# Patient Record
Sex: Female | Born: 2000 | Race: White | Hispanic: No | Marital: Single | Smoking: Never smoker
Health system: Southern US, Community
[De-identification: ages and names within clinical notes are randomized; demographics above are authoritative.]

## PROBLEM LIST (undated history)

## (undated) DIAGNOSIS — F4310 Post-traumatic stress disorder, unspecified: Secondary | ICD-10-CM

## (undated) DIAGNOSIS — F329 Major depressive disorder, single episode, unspecified: Secondary | ICD-10-CM

## (undated) DIAGNOSIS — F909 Attention-deficit hyperactivity disorder, unspecified type: Secondary | ICD-10-CM

## (undated) DIAGNOSIS — F32A Depression, unspecified: Secondary | ICD-10-CM

---
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
## 2018-01-01 ENCOUNTER — Ambulatory Visit (INDEPENDENT_AMBULATORY_CARE_PROVIDER_SITE_OTHER): Payer: 59 | Admitting: Licensed Clinical Social Worker

## 2018-01-01 DIAGNOSIS — F331 Major depressive disorder, recurrent, moderate: Secondary | ICD-10-CM | POA: Diagnosis not present

## 2018-01-02 ENCOUNTER — Encounter (HOSPITAL_COMMUNITY): Payer: Self-pay | Admitting: Licensed Clinical Social Worker

## 2018-01-02 ENCOUNTER — Telehealth (HOSPITAL_COMMUNITY): Payer: Self-pay

## 2018-01-02 NOTE — Telephone Encounter (Signed)
Patient said that she needs the letter for her medical insurance

## 2018-01-02 NOTE — Telephone Encounter (Signed)
Have her bring in the request from the insurance company

## 2018-01-02 NOTE — Telephone Encounter (Signed)
I need more information about why she needs it and who it is for. Since letters are part of the EMR we cannot email them. I can print out a copy for her or fax it

## 2018-01-02 NOTE — Telephone Encounter (Signed)
Patient called requesting a letter stating that she is a patient her and she is being treated by you. Patient asked if she could be e-mail the letter. Please advise

## 2018-01-02 NOTE — Telephone Encounter (Signed)
Called and spoke with patient she said that she will drop off the request from the insurance company

## 2018-01-02 NOTE — Progress Notes (Signed)
   THERAPIST PROGRESS NOTE  Session Time: 2:50 pm-3:30 pm  Participation Level: Active  Behavioral Response: CasualAlertEuthymic  Type of Therapy: Individual Therapy  Treatment Goals addressed: Coping  Interventions: CBT and Solution Focused  Summary: Judith Cole is a 17 y.o. female who presents with Patient is an 17 year old Caucasian female that presents oriented x5 (person, place, sitaution, time, and object), alert, euthymic, average height, average weight, casually dressed, appropriately groomed and cooperative to address mood. Patient has limited medical treatment history. She has a history of mental health treatment including outpatient therapy, medication management, hospitalization, and group home. Patient has symptoms of mania including periods of euphoria, increased energy, and decrease of sleep. Patient denies suicidal and homicidal ideations. Patient denies psychosis including auditory and visual hallucinations. Patient admits to cannabis use to manage mood. Patient denies history of elopement. She is at low risk for lethality at this time.  Physically: Patient is doing well physically. She has felt tired  Spiritual/values: No issues identified  Relationships: Patient's polyamorous relationship is going well.  Emotional/Mental/Behavior: Patient's mood is stable. She has experienced sensory issues especially when time gets stressed. After discussion, patient understood she needs to build up her tolerance level. Patient has been able to do it before with clothing. Patient understood that she needs to "practice" around the house with her socks and pants that cause her distress. Patient also noted a feeling of de-personalization when she wakes up in the morning. After discussion, patient understood that she needs to use grounding techniques to help bring her back to reality. She also agreed to focus on her ring or another physical object that she can touch when she wakes up in the  morning to know that she is awake.   Suicidal/Homicidal: Negativewithout intent/plan  Therapist Response: Therapist reviewed patient's recent thoughts and behaviors. Therapist utilized CBT to address mood. Therapist processed patient's feelings to identify triggers for mood. Therapist discussed distress tolerance and grounding techniques.  Plan: Return again in 3 weeks.  Diagnosis: Axis I: Major depressive disorder, recurrent episode, moderate with anxious distress    Axis II: No diagnosis    Judith BellowsJoshua Livvy Spilman, LCSW 01/02/2018

## 2018-01-07 ENCOUNTER — Telehealth (HOSPITAL_COMMUNITY): Payer: Self-pay | Admitting: *Deleted

## 2018-01-07 NOTE — Telephone Encounter (Signed)
Dr Tenny Crawoss Patient came in today requesting information that [facility] sent her via e-mail. They are requesting documentation from the provider detailing their services , frequency of services & duration services needed, And what services are rendered ( therapy & provider). Also professional recommendations if services are still  needed

## 2018-01-07 NOTE — Telephone Encounter (Signed)
ok 

## 2018-02-04 ENCOUNTER — Encounter (HOSPITAL_COMMUNITY): Payer: Self-pay | Admitting: Psychiatry

## 2018-02-04 ENCOUNTER — Ambulatory Visit (INDEPENDENT_AMBULATORY_CARE_PROVIDER_SITE_OTHER): Payer: 59 | Admitting: Psychiatry

## 2018-02-04 VITALS — BP 107/74 | HR 87 | Ht 66.0 in | Wt 159.0 lb

## 2018-02-04 DIAGNOSIS — Z813 Family history of other psychoactive substance abuse and dependence: Secondary | ICD-10-CM

## 2018-02-04 DIAGNOSIS — R450 Nervousness: Secondary | ICD-10-CM

## 2018-02-04 DIAGNOSIS — F1290 Cannabis use, unspecified, uncomplicated: Secondary | ICD-10-CM

## 2018-02-04 DIAGNOSIS — Z818 Family history of other mental and behavioral disorders: Secondary | ICD-10-CM

## 2018-02-04 DIAGNOSIS — F331 Major depressive disorder, recurrent, moderate: Secondary | ICD-10-CM

## 2018-02-04 DIAGNOSIS — F4310 Post-traumatic stress disorder, unspecified: Secondary | ICD-10-CM | POA: Diagnosis not present

## 2018-02-04 MED ORDER — LAMOTRIGINE 100 MG PO TABS
100.0000 mg | ORAL_TABLET | Freq: Every day | ORAL | 2 refills | Status: DC
Start: 2018-02-04 — End: 2018-03-18

## 2018-02-04 MED ORDER — SERTRALINE HCL 100 MG PO TABS: 150.0000 mg | ORAL_TABLET | Freq: Every day | ORAL | 2 refills | Status: DC

## 2018-02-04 MED ORDER — HYDROXYZINE HCL 25 MG PO TABS: 50.0000 mg | ORAL_TABLET | Freq: Three times a day (TID) | ORAL | 2 refills | Status: DC | PRN

## 2018-02-04 MED ORDER — QUETIAPINE FUMARATE 100 MG PO TABS: 100.0000 mg | ORAL_TABLET | Freq: Every day | ORAL | 2 refills | Status: DC
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
## 2018-02-24 ENCOUNTER — Ambulatory Visit (HOSPITAL_COMMUNITY): Payer: Self-pay | Admitting: Licensed Clinical Social Worker

## 2018-03-18 ENCOUNTER — Ambulatory Visit (INDEPENDENT_AMBULATORY_CARE_PROVIDER_SITE_OTHER): Payer: 59 | Admitting: Psychiatry

## 2018-03-18 ENCOUNTER — Encounter (HOSPITAL_COMMUNITY): Payer: Self-pay | Admitting: Psychiatry

## 2018-03-18 VITALS — BP 108/74 | HR 73 | Ht 66.0 in | Wt 156.0 lb

## 2018-03-18 DIAGNOSIS — F4310 Post-traumatic stress disorder, unspecified: Secondary | ICD-10-CM

## 2018-03-18 DIAGNOSIS — F1290 Cannabis use, unspecified, uncomplicated: Secondary | ICD-10-CM

## 2018-03-18 DIAGNOSIS — F331 Major depressive disorder, recurrent, moderate: Secondary | ICD-10-CM | POA: Diagnosis not present

## 2018-03-18 MED ORDER — QUETIAPINE FUMARATE 100 MG PO TABS: 100.0000 mg | ORAL_TABLET | Freq: Every day | ORAL | 2 refills | Status: DC

## 2018-03-18 MED ORDER — LAMOTRIGINE 100 MG PO TABS: 100.0000 mg | ORAL_TABLET | Freq: Every day | ORAL | 2 refills | Status: DC

## 2018-03-18 MED ORDER — SERTRALINE HCL 100 MG PO TABS: 150.0000 mg | ORAL_TABLET | Freq: Every day | ORAL | 2 refills | Status: DC

## 2018-03-18 MED ORDER — HYDROXYZINE HCL 25 MG PO TABS: 50.0000 mg | ORAL_TABLET | Freq: Three times a day (TID) | ORAL | 2 refills | Status: DC | PRN
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
## 2018-05-19 ENCOUNTER — Ambulatory Visit (INDEPENDENT_AMBULATORY_CARE_PROVIDER_SITE_OTHER): Payer: 59 | Admitting: Psychiatry

## 2018-05-19 ENCOUNTER — Encounter (HOSPITAL_COMMUNITY): Payer: Self-pay | Admitting: Psychiatry

## 2018-05-19 VITALS — BP 110/75 | HR 84 | Ht 66.0 in | Wt 166.0 lb

## 2018-05-19 DIAGNOSIS — F4310 Post-traumatic stress disorder, unspecified: Secondary | ICD-10-CM

## 2018-05-19 DIAGNOSIS — F331 Major depressive disorder, recurrent, moderate: Secondary | ICD-10-CM | POA: Diagnosis not present

## 2018-05-19 MED ORDER — QUETIAPINE FUMARATE 100 MG PO TABS: 100.0000 mg | ORAL_TABLET | Freq: Every day | ORAL | 2 refills | Status: DC

## 2018-05-19 MED ORDER — SERTRALINE HCL 100 MG PO TABS: 200.0000 mg | ORAL_TABLET | Freq: Every day | ORAL | 2 refills | Status: DC

## 2018-05-19 MED ORDER — HYDROXYZINE HCL 25 MG PO TABS: 50.0000 mg | ORAL_TABLET | Freq: Three times a day (TID) | ORAL | 2 refills | Status: DC | PRN

## 2018-05-19 MED ORDER — LAMOTRIGINE 100 MG PO TABS: 100.0000 mg | ORAL_TABLET | Freq: Every day | ORAL | 2 refills | Status: DC
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
## 2018-05-28 ENCOUNTER — Ambulatory Visit (HOSPITAL_COMMUNITY): Payer: 59 | Admitting: Licensed Clinical Social Worker

## 2018-07-01 ENCOUNTER — Ambulatory Visit (HOSPITAL_COMMUNITY): Payer: 59 | Admitting: Psychiatry

## 2018-07-16 ENCOUNTER — Telehealth (HOSPITAL_COMMUNITY): Payer: Self-pay | Admitting: *Deleted

## 2018-07-16 ENCOUNTER — Other Ambulatory Visit (HOSPITAL_COMMUNITY): Payer: Self-pay | Admitting: Psychiatry

## 2018-07-16 MED ORDER — SERTRALINE HCL 100 MG PO TABS: 200.0000 mg | ORAL_TABLET | Freq: Every day | ORAL | 2 refills | Status: DC

## 2018-07-16 NOTE — Telephone Encounter (Signed)
I resent it. It had already been sent last month, should have been at the pharmacy

## 2018-07-16 NOTE — Telephone Encounter (Signed)
Dr Tenny Craw Patient called to request refill on Zoloft she states she only has 1 pill left. Stated she missed her last appointment & I informed he that she would need to make new appointment. I informed that I would send request to you & then transferred to [facility] for appointment

## 2018-07-29 ENCOUNTER — Ambulatory Visit (HOSPITAL_COMMUNITY): Payer: 59 | Admitting: Psychiatry

## 2018-08-20 ENCOUNTER — Ambulatory Visit (INDEPENDENT_AMBULATORY_CARE_PROVIDER_SITE_OTHER): Payer: 59 | Admitting: Psychiatry

## 2018-08-20 ENCOUNTER — Encounter (HOSPITAL_COMMUNITY): Payer: Self-pay | Admitting: Psychiatry

## 2018-08-20 VITALS — BP 122/76 | HR 106 | Ht 66.0 in | Wt 161.0 lb

## 2018-08-20 DIAGNOSIS — F331 Major depressive disorder, recurrent, moderate: Secondary | ICD-10-CM | POA: Diagnosis not present

## 2018-08-20 DIAGNOSIS — F4310 Post-traumatic stress disorder, unspecified: Secondary | ICD-10-CM | POA: Diagnosis not present

## 2018-08-20 MED ORDER — LAMOTRIGINE 100 MG PO TABS: 100.0000 mg | ORAL_TABLET | Freq: Every day | ORAL | 2 refills | Status: DC

## 2018-08-20 MED ORDER — SERTRALINE HCL 100 MG PO TABS: 200.0000 mg | ORAL_TABLET | Freq: Every day | ORAL | 2 refills | Status: DC

## 2018-08-20 MED ORDER — HYDROXYZINE HCL 10 MG PO TABS: 10.0000 mg | ORAL_TABLET | Freq: Two times a day (BID) | ORAL | 2 refills | Status: DC | PRN

## 2018-08-20 MED ORDER — QUETIAPINE FUMARATE 100 MG PO TABS: 100.0000 mg | ORAL_TABLET | Freq: Every day | ORAL | 2 refills | Status: DC
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
## 2018-09-13 ENCOUNTER — Other Ambulatory Visit (HOSPITAL_COMMUNITY): Payer: Self-pay | Admitting: Psychiatry

## 2018-09-15 ENCOUNTER — Other Ambulatory Visit (HOSPITAL_COMMUNITY): Payer: Self-pay | Admitting: Psychiatry

## 2018-09-15 MED ORDER — HYDROXYZINE HCL 10 MG PO TABS: 10.0000 mg | ORAL_TABLET | Freq: Two times a day (BID) | ORAL | 1 refills | Status: DC | PRN

## 2018-11-21 ENCOUNTER — Encounter (HOSPITAL_COMMUNITY): Payer: Self-pay | Admitting: Psychiatry

## 2018-11-21 ENCOUNTER — Other Ambulatory Visit: Payer: Self-pay

## 2018-11-21 ENCOUNTER — Ambulatory Visit (INDEPENDENT_AMBULATORY_CARE_PROVIDER_SITE_OTHER): Payer: 59 | Admitting: Psychiatry

## 2018-11-21 DIAGNOSIS — F4310 Post-traumatic stress disorder, unspecified: Secondary | ICD-10-CM

## 2018-11-21 DIAGNOSIS — F331 Major depressive disorder, recurrent, moderate: Secondary | ICD-10-CM

## 2018-11-21 MED ORDER — QUETIAPINE FUMARATE 100 MG PO TABS: 100.0000 mg | ORAL_TABLET | Freq: Every day | ORAL | 2 refills | Status: DC

## 2018-11-21 MED ORDER — LAMOTRIGINE 100 MG PO TABS: 100.0000 mg | ORAL_TABLET | Freq: Every day | ORAL | 2 refills | Status: DC

## 2018-11-21 MED ORDER — SERTRALINE HCL 100 MG PO TABS: 200.0000 mg | ORAL_TABLET | Freq: Every day | ORAL | 2 refills | Status: DC
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
## 2019-01-02 IMAGING — DX DG ANKLE COMPLETE 3+V*R*
3 series · 3 of 3 positions shown · non-contrast
Comparison: None.

CLINICAL DATA: Acute RIGHT ankle pain following injury today.
Initial encounter.

EXAM:
RIGHT ANKLE - COMPLETE 3+ VIEW

[ankle ap]
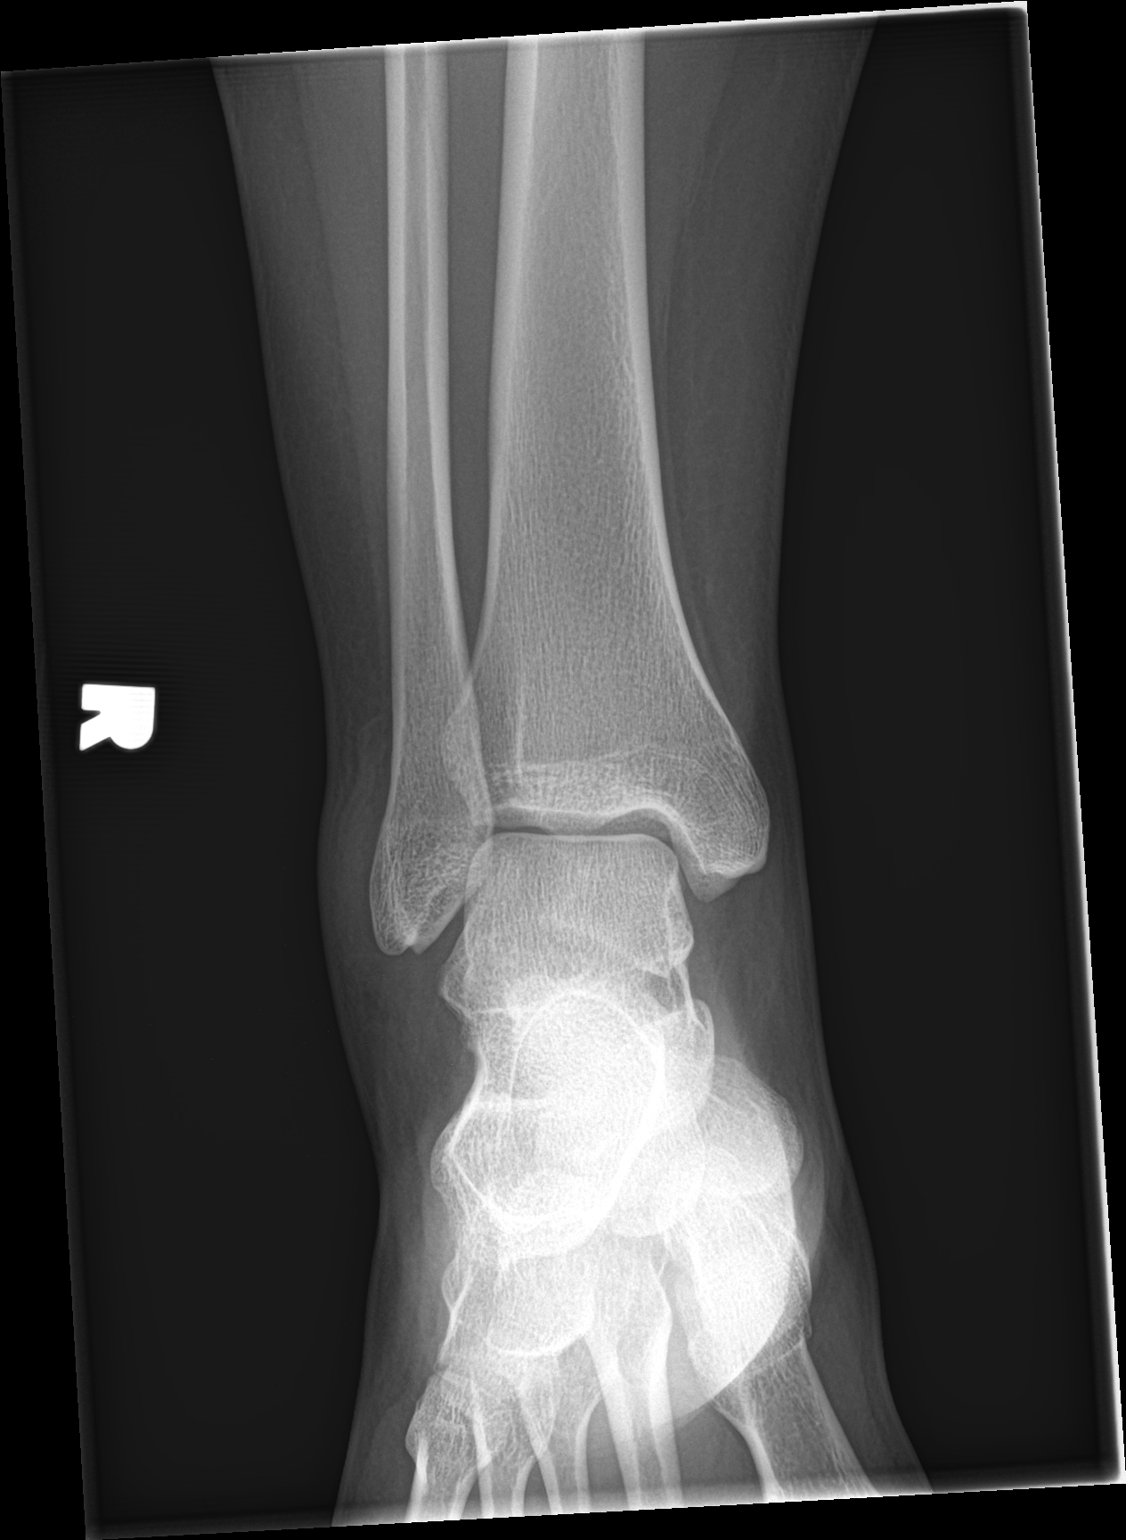

[ankle obl]
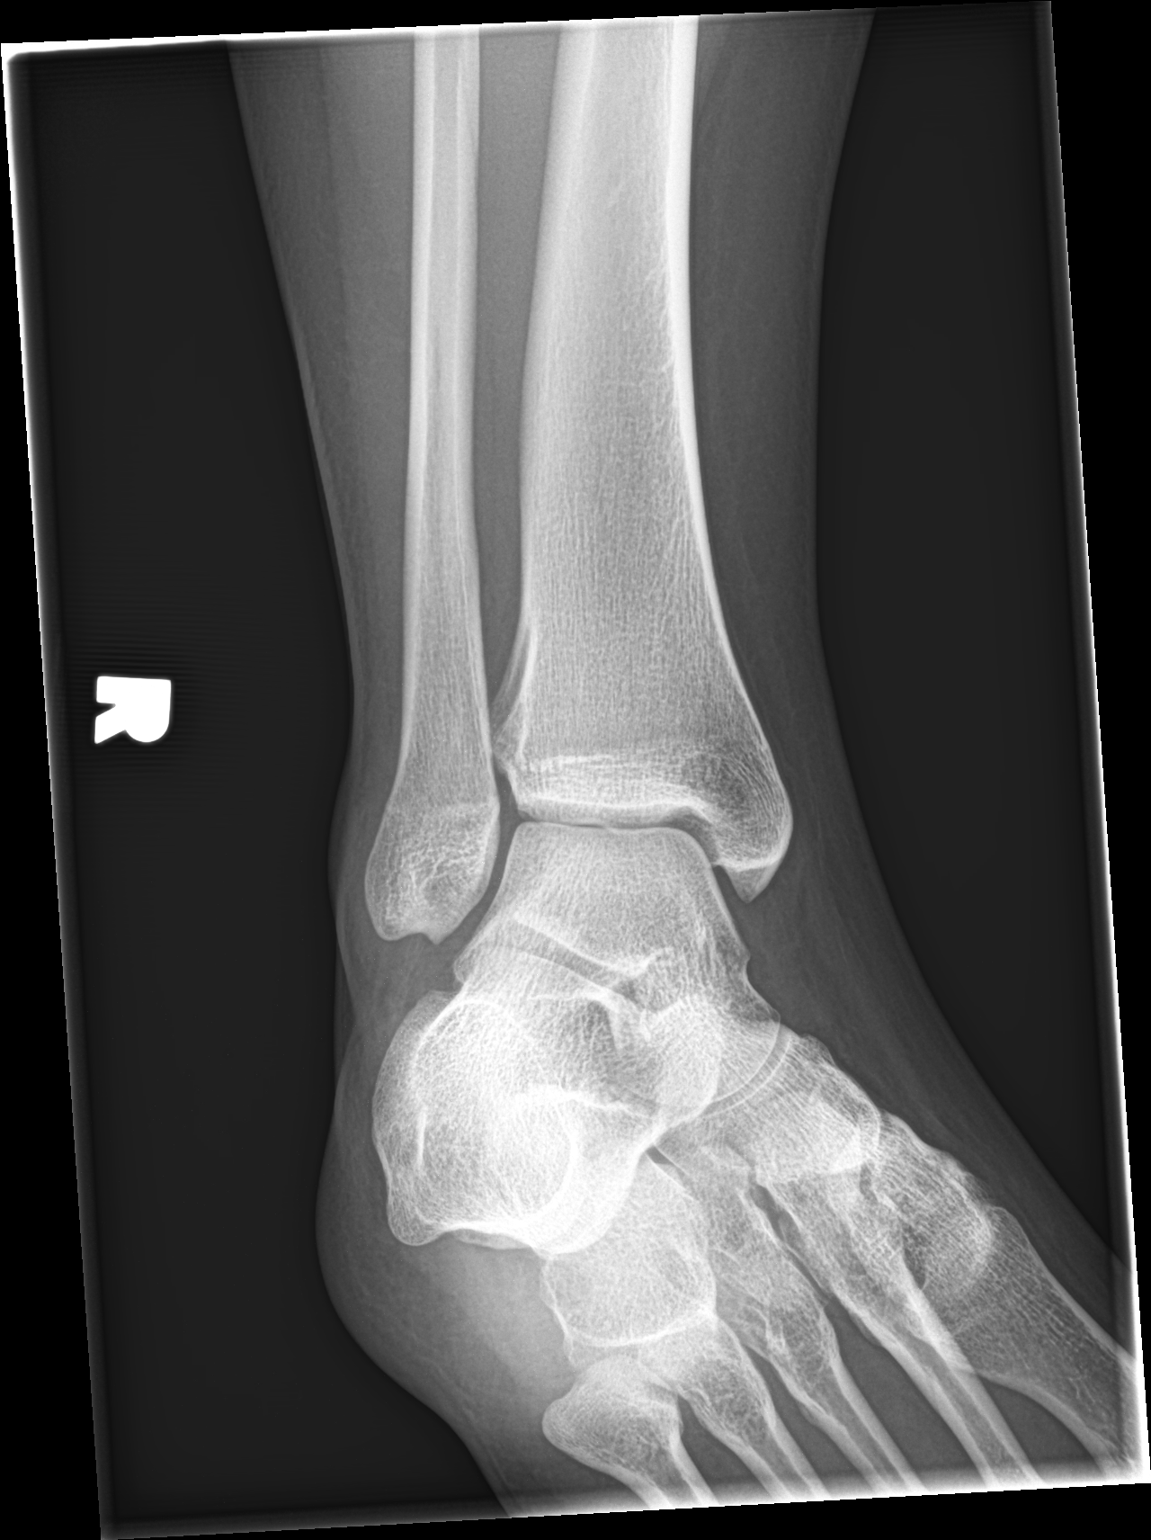

[ankle lat]
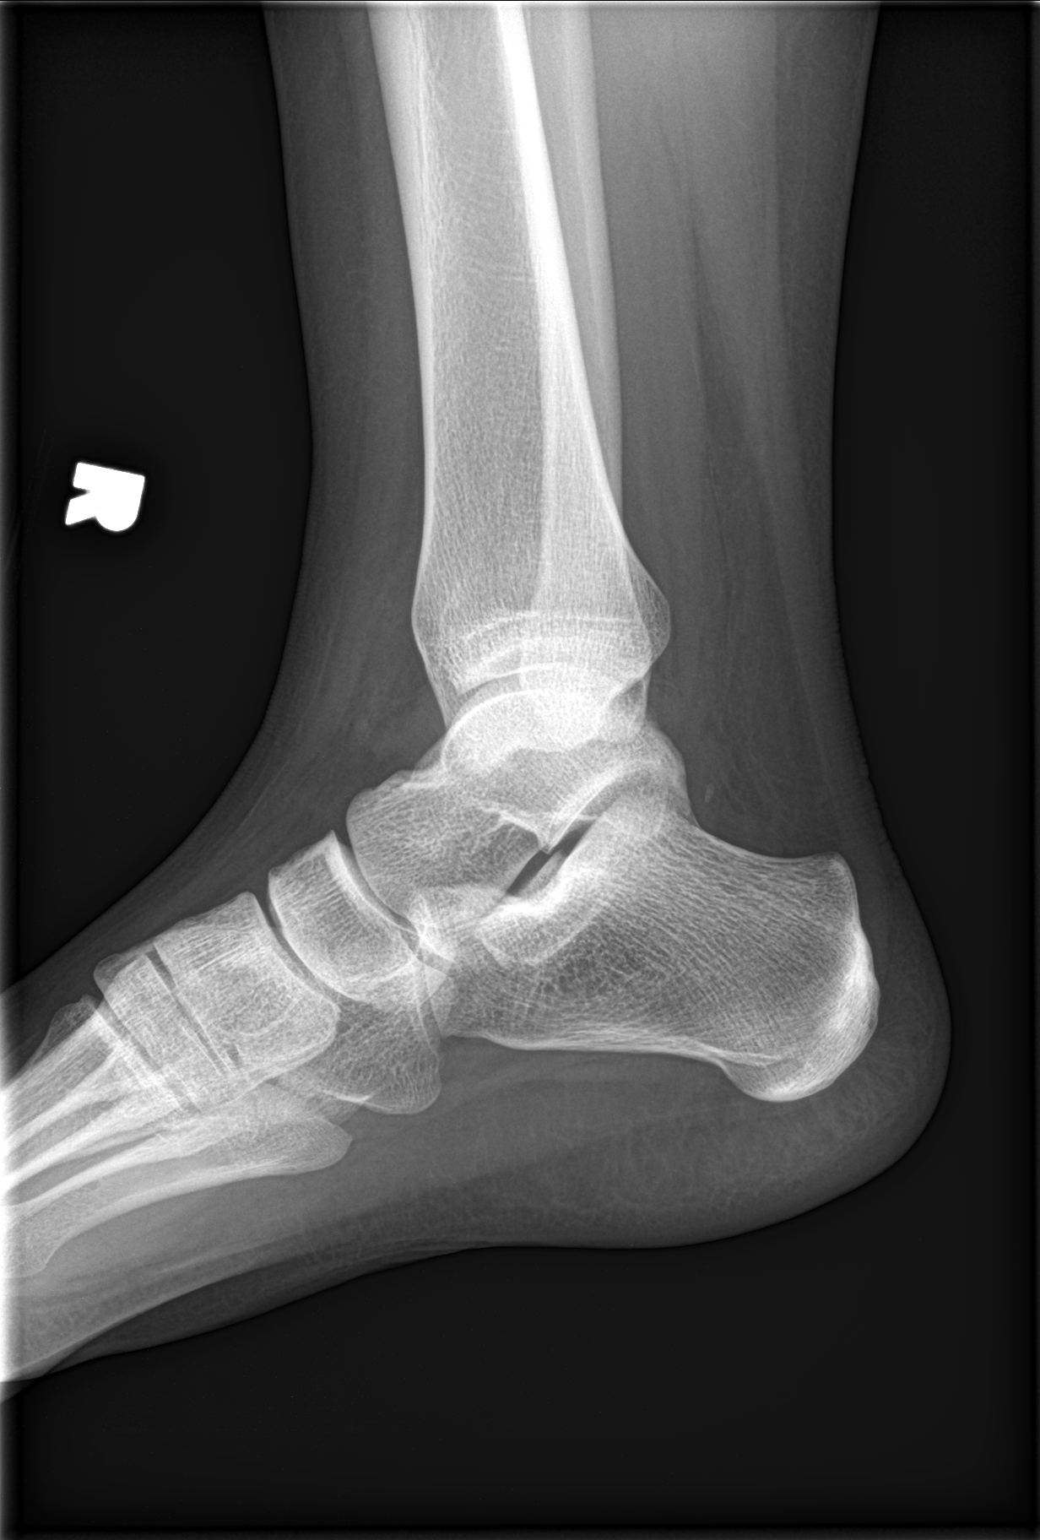

[3 of 3 positions shown; findings below may reference images not displayed]

FINDINGS: There is no evidence of acute fracture, subluxation or dislocation.

LATERAL soft tissue swelling and joint effusion noted.

No focal bony lesions are present.
IMPRESSION: Soft tissue swelling and joint effusion.  No acute bony abnormality.

## 2019-02-13 ENCOUNTER — Encounter (HOSPITAL_COMMUNITY): Payer: Self-pay | Admitting: Psychiatry

## 2019-02-19 ENCOUNTER — Other Ambulatory Visit: Payer: Self-pay

## 2019-02-19 ENCOUNTER — Encounter (HOSPITAL_COMMUNITY): Payer: Self-pay | Admitting: Psychiatry

## 2019-02-19 ENCOUNTER — Telehealth (HOSPITAL_COMMUNITY): Payer: Self-pay | Admitting: *Deleted

## 2019-02-19 ENCOUNTER — Ambulatory Visit (INDEPENDENT_AMBULATORY_CARE_PROVIDER_SITE_OTHER): Payer: 59 | Admitting: Psychiatry

## 2019-02-19 DIAGNOSIS — F4310 Post-traumatic stress disorder, unspecified: Secondary | ICD-10-CM | POA: Diagnosis not present

## 2019-02-19 DIAGNOSIS — F331 Major depressive disorder, recurrent, moderate: Secondary | ICD-10-CM | POA: Diagnosis not present

## 2019-02-19 MED ORDER — QUETIAPINE FUMARATE 100 MG PO TABS: 100.0000 mg | ORAL_TABLET | Freq: Every day | ORAL | 2 refills | Status: DC

## 2019-02-19 MED ORDER — HYDROXYZINE HCL 10 MG PO TABS: 10.0000 mg | ORAL_TABLET | Freq: Two times a day (BID) | ORAL | 1 refills | Status: DC | PRN

## 2019-02-19 MED ORDER — LAMOTRIGINE 100 MG PO TABS: 100.0000 mg | ORAL_TABLET | Freq: Every day | ORAL | 2 refills | Status: DC

## 2019-02-19 MED ORDER — SERTRALINE HCL 100 MG PO TABS: 200.0000 mg | ORAL_TABLET | Freq: Every day | ORAL | 2 refills | Status: DC

## 2019-02-19 NOTE — Telephone Encounter (Signed)
[place] TRACK PRIOR AUTHORIZATION APPROVAL  APPROVED QUETIAPINE FUMARATE 100 MG TABLETS  P.A. # [id number]  EFFECTIVE: 02/19/2019 UNTIL 02/14/2020
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
## 2019-05-21 ENCOUNTER — Other Ambulatory Visit: Payer: Self-pay

## 2019-05-21 ENCOUNTER — Encounter (HOSPITAL_COMMUNITY): Payer: Self-pay | Admitting: Psychiatry

## 2019-05-21 ENCOUNTER — Ambulatory Visit (INDEPENDENT_AMBULATORY_CARE_PROVIDER_SITE_OTHER): Payer: 59 | Admitting: Psychiatry

## 2019-05-21 DIAGNOSIS — F331 Major depressive disorder, recurrent, moderate: Secondary | ICD-10-CM

## 2019-05-21 DIAGNOSIS — F4310 Post-traumatic stress disorder, unspecified: Secondary | ICD-10-CM | POA: Diagnosis not present

## 2019-05-21 MED ORDER — LAMOTRIGINE 100 MG PO TABS: 100.0000 mg | ORAL_TABLET | Freq: Every day | ORAL | 2 refills | Status: DC

## 2019-05-21 MED ORDER — QUETIAPINE FUMARATE 100 MG PO TABS: 100.0000 mg | ORAL_TABLET | Freq: Every day | ORAL | 2 refills | Status: DC

## 2019-05-21 MED ORDER — SERTRALINE HCL 100 MG PO TABS: 200.0000 mg | ORAL_TABLET | Freq: Every day | ORAL | 2 refills | Status: DC
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
## 2019-08-24 ENCOUNTER — Ambulatory Visit (HOSPITAL_COMMUNITY): Payer: 59 | Admitting: Psychiatry

## 2020-06-27 ENCOUNTER — Other Ambulatory Visit (HOSPITAL_COMMUNITY): Payer: Self-pay | Admitting: Psychiatry

## 2020-06-28 ENCOUNTER — Other Ambulatory Visit (HOSPITAL_COMMUNITY): Payer: Self-pay | Admitting: Psychiatry

## 2020-06-29 ENCOUNTER — Other Ambulatory Visit (HOSPITAL_COMMUNITY): Payer: Self-pay | Admitting: Psychiatry

## 2020-06-29 ENCOUNTER — Telehealth (HOSPITAL_COMMUNITY): Payer: Self-pay | Admitting: Psychiatry

## 2020-06-29 NOTE — Telephone Encounter (Signed)
You can schedule her, I saw her for a long time

## 2020-06-29 NOTE — Telephone Encounter (Signed)
Patient called asking for medications and has not been seen in over a year and 1 month +. Advised if patients have not been seen in 1year or more new referral is needed. Advised provider would need to do new evaluation to verify the same meds along with same mgs are needed, etc., and this is if provider accepts new referral. Advised patient that provider only accepts referrals for children at this time. Did notify patient that I would inform provider of her call.

## 2020-06-29 NOTE — Telephone Encounter (Signed)
Dr. Tenny Craw I wanted to make sure, you said you've seen this patient for years. It shows she was seen for the first time in 06/2017 and she stopped scheduling appointments after 05/2019. I just wanted to make sure we are talking about the same patient...but I will call her and schedule I just wanted to confirm/ verify

## 2020-06-30 ENCOUNTER — Other Ambulatory Visit: Payer: Self-pay

## 2020-06-30 ENCOUNTER — Telehealth (INDEPENDENT_AMBULATORY_CARE_PROVIDER_SITE_OTHER): Payer: 59 | Admitting: Psychiatry

## 2020-06-30 ENCOUNTER — Encounter (HOSPITAL_COMMUNITY): Payer: Self-pay | Admitting: Psychiatry

## 2020-06-30 DIAGNOSIS — F4310 Post-traumatic stress disorder, unspecified: Secondary | ICD-10-CM | POA: Diagnosis not present

## 2020-06-30 DIAGNOSIS — F331 Major depressive disorder, recurrent, moderate: Secondary | ICD-10-CM

## 2020-06-30 MED ORDER — QUETIAPINE FUMARATE 100 MG PO TABS
100.0000 mg | ORAL_TABLET | Freq: Every day | ORAL | 2 refills | Status: DC
Start: 2020-06-30 — End: 2020-09-26

## 2020-06-30 MED ORDER — SERTRALINE HCL 100 MG PO TABS: 200.0000 mg | ORAL_TABLET | Freq: Every day | ORAL | 2 refills | Status: DC

## 2020-06-30 MED ORDER — LAMOTRIGINE 100 MG PO TABS
100.0000 mg | ORAL_TABLET | Freq: Every day | ORAL | 2 refills | Status: DC
Start: 2020-06-30 — End: 2020-09-26
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
## 2020-09-26 ENCOUNTER — Encounter (HOSPITAL_COMMUNITY): Payer: Self-pay | Admitting: Psychiatry

## 2020-09-26 ENCOUNTER — Other Ambulatory Visit: Payer: Self-pay

## 2020-09-26 ENCOUNTER — Telehealth (INDEPENDENT_AMBULATORY_CARE_PROVIDER_SITE_OTHER): Payer: 59 | Admitting: Psychiatry

## 2020-09-26 DIAGNOSIS — F331 Major depressive disorder, recurrent, moderate: Secondary | ICD-10-CM

## 2020-09-26 DIAGNOSIS — F4310 Post-traumatic stress disorder, unspecified: Secondary | ICD-10-CM | POA: Diagnosis not present

## 2020-09-26 MED ORDER — QUETIAPINE FUMARATE 100 MG PO TABS: 100.0000 mg | ORAL_TABLET | Freq: Every day | ORAL | 2 refills | Status: DC

## 2020-09-26 MED ORDER — LAMOTRIGINE 100 MG PO TABS
100.0000 mg | ORAL_TABLET | Freq: Every day | ORAL | 2 refills | Status: DC
Start: 2020-09-26 — End: 2020-11-30

## 2020-09-26 MED ORDER — SERTRALINE HCL 100 MG PO TABS: 200.0000 mg | ORAL_TABLET | Freq: Every day | ORAL | 2 refills | Status: DC
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
## 2020-09-29 ENCOUNTER — Encounter (HOSPITAL_COMMUNITY): Payer: Self-pay

## 2020-11-29 ENCOUNTER — Telehealth (HOSPITAL_COMMUNITY): Payer: Self-pay | Admitting: *Deleted

## 2020-11-29 NOTE — Telephone Encounter (Signed)
Patient is needing refills for all of her medications provider prescribes for her. Patient appt was cancelled due to provider being on medical leave. Message was left to call office to resch appt.  

## 2020-11-30 ENCOUNTER — Other Ambulatory Visit (HOSPITAL_COMMUNITY): Payer: Self-pay | Admitting: Psychiatry

## 2020-11-30 MED ORDER — SERTRALINE HCL 100 MG PO TABS: 200.0000 mg | ORAL_TABLET | Freq: Every day | ORAL | 2 refills | Status: DC

## 2020-11-30 MED ORDER — LAMOTRIGINE 100 MG PO TABS: 100.0000 mg | ORAL_TABLET | Freq: Every day | ORAL | 2 refills | Status: DC

## 2020-11-30 MED ORDER — QUETIAPINE FUMARATE 100 MG PO TABS: 100.0000 mg | ORAL_TABLET | Freq: Every day | ORAL | 2 refills | Status: DC

## 2020-11-30 NOTE — Telephone Encounter (Signed)
sent 

## 2020-12-26 ENCOUNTER — Telehealth (HOSPITAL_COMMUNITY): Payer: 59 | Admitting: Psychiatry

## 2021-08-01 ENCOUNTER — Telehealth (HOSPITAL_COMMUNITY): Payer: Self-pay | Admitting: *Deleted

## 2021-08-01 NOTE — Telephone Encounter (Signed)
Called patient to schedule f/u for patient and was not able to reach her. Phone kept ringing with no voicemail.

## 2021-08-02 ENCOUNTER — Telehealth (HOSPITAL_COMMUNITY): Payer: 59 | Admitting: Psychiatry

## 2021-08-02 ENCOUNTER — Other Ambulatory Visit: Payer: Self-pay

## 2021-08-02 ENCOUNTER — Encounter (HOSPITAL_COMMUNITY): Payer: Self-pay | Admitting: Psychiatry

## 2021-08-02 ENCOUNTER — Telehealth (INDEPENDENT_AMBULATORY_CARE_PROVIDER_SITE_OTHER): Payer: 59 | Admitting: Psychiatry

## 2021-08-02 DIAGNOSIS — F4310 Post-traumatic stress disorder, unspecified: Secondary | ICD-10-CM | POA: Diagnosis not present

## 2021-08-02 DIAGNOSIS — F331 Major depressive disorder, recurrent, moderate: Secondary | ICD-10-CM | POA: Diagnosis not present

## 2021-08-02 MED ORDER — SERTRALINE HCL 100 MG PO TABS: 200.0000 mg | ORAL_TABLET | Freq: Every day | ORAL | 2 refills | Status: DC

## 2021-08-02 MED ORDER — LAMOTRIGINE 100 MG PO TABS: 100.0000 mg | ORAL_TABLET | Freq: Every day | ORAL | 2 refills | Status: DC

## 2021-08-02 MED ORDER — QUETIAPINE FUMARATE 100 MG PO TABS: 100.0000 mg | ORAL_TABLET | Freq: Every day | ORAL | 2 refills | Status: DC
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
## 2021-10-28 ENCOUNTER — Encounter (HOSPITAL_COMMUNITY): Payer: Self-pay

## 2021-11-01 ENCOUNTER — Other Ambulatory Visit (HOSPITAL_COMMUNITY): Payer: Self-pay | Admitting: Psychiatry

## 2021-11-01 MED ORDER — BUPROPION HCL ER (XL) 150 MG PO TB24: 150.0000 mg | ORAL_TABLET | ORAL | 2 refills | Status: DC

## 2021-11-20 ENCOUNTER — Other Ambulatory Visit (HOSPITAL_COMMUNITY): Payer: Self-pay | Admitting: Psychiatry

## 2021-11-20 MED ORDER — BUPROPION HCL ER (XL) 300 MG PO TB24: 300.0000 mg | ORAL_TABLET | ORAL | 2 refills | Status: DC

## 2021-12-18 ENCOUNTER — Other Ambulatory Visit (HOSPITAL_COMMUNITY): Payer: Self-pay | Admitting: Psychiatry

## 2022-01-30 ENCOUNTER — Telehealth (INDEPENDENT_AMBULATORY_CARE_PROVIDER_SITE_OTHER): Payer: 59 | Admitting: Psychiatry

## 2022-01-30 ENCOUNTER — Encounter (HOSPITAL_COMMUNITY): Payer: Self-pay | Admitting: Psychiatry

## 2022-01-30 DIAGNOSIS — F4310 Post-traumatic stress disorder, unspecified: Secondary | ICD-10-CM

## 2022-01-30 DIAGNOSIS — F331 Major depressive disorder, recurrent, moderate: Secondary | ICD-10-CM | POA: Diagnosis not present

## 2022-01-30 MED ORDER — BUPROPION HCL ER (XL) 300 MG PO TB24: 300.0000 mg | ORAL_TABLET | ORAL | 1 refills | Status: DC

## 2022-01-30 MED ORDER — QUETIAPINE FUMARATE 100 MG PO TABS: 100.0000 mg | ORAL_TABLET | Freq: Every day | ORAL | 2 refills | Status: DC

## 2022-01-30 MED ORDER — SERTRALINE HCL 100 MG PO TABS: 150.0000 mg | ORAL_TABLET | Freq: Every day | ORAL | 2 refills | Status: DC

## 2022-01-30 MED ORDER — LAMOTRIGINE 100 MG PO TABS: 100.0000 mg | ORAL_TABLET | Freq: Every day | ORAL | 2 refills | Status: DC

## 2022-01-30 NOTE — Progress Notes (Signed)
Virtual Visit via Telephone Note  I connected with [facility] on 01/30/22 at  2:00 PM EDT by telephone and verified that I am speaking with the correct person using two identifiers.  Location: Patient: home Provider: office   I discussed the limitations, risks, security and privacy concerns of performing an evaluation and management service by telephone and the availability of in person appointments. I also discussed with the patient that there may be a patient responsible charge related to this service. The patient expressed understanding and agreed to proceed.      I discussed the assessment and treatment plan with the patient. The patient was provided an opportunity to ask questions and all were answered. The patient agreed with the plan and demonstrated an understanding of the instructions.   The patient was advised to call back or seek an in-person evaluation if the symptoms worsen or if the condition fails to improve as anticipated.  I provided 15 minutes of non-face-to-face time during this encounter.   Diannia Ruder, MD  [facility] MD/PA/NP OP Progress Note  01/30/2022 2:56 PM Judith Cole  MRN:  [id number]  Chief Complaint:  Chief Complaint  Patient presents with  . Anxiety  . Depression  . Follow-up   HPI: This patient is a 21 year old female who lives with her boyfriend in [place].  She is currently unemployed.  The patient returns for follow-up after about 6 months.  She has a history of depression mood swings and posttraumatic stress disorder.  She had communicated with me but tween in stating she was developing sexual side effects from her medications.  We had started Wellbutrin and she is up to 300 mg daily.  This seems to have made a big difference and she has also been able to to almost quit vaping and she is smoking much less marijuana.  She does drink something called "mock tails" on a daily basis "to deal with the heat.  I urged her to try to cut this back.   Overall however she states that her mood is very good and she denies significant depression anxiety difficulty sleeping or nightmares or flashbacks.  She thinks her current regimen is working well.  We had talked about reducing the Zoloft slowly and we can go down from 200 to 150 mg Visit Diagnosis:    ICD-10-CM   1. Major depressive disorder, recurrent episode, moderate with anxious distress (HCC)  F33.1     2. PTSD (post-traumatic stress disorder)  F43.10       Past Psychiatric History: Past hospitalizations in 1 stay in a PT RF is a teenager.  [facility]  Past Medical History:  Past Medical History:  Diagnosis Date  . ADHD (attention deficit hyperactivity disorder)   . Depression   . PTSD (post-traumatic stress disorder)    History reviewed. No pertinent surgical history.  Family Psychiatric History: see below  Family History:  Family History  Problem Relation Age of Onset  . Drug abuse Mother   . Bipolar disorder Mother   . Drug abuse Father   . Eating disorder Sister   . Drug abuse Sister   . Drug abuse Sister     Social History:  Social History   Socioeconomic History  . Marital status: Single    Spouse name: Not on file  . Number of children: Not on file  . Years of education: Not on file  . Highest education level: Not on file  Occupational History  .  Not on file  Tobacco Use  . Smoking status: Never  . Smokeless tobacco: Never  Vaping Use  . Vaping Use: Every day  Substance and Sexual Activity  . Alcohol use: Yes    Comment: social  . Drug use: Yes    Frequency: 1.0 times per week    Types: Marijuana    Comment: smokes in the evenings  . Sexual activity: Yes    Birth control/protection: Inserts  Other Topics Concern  . Not on file  Social History Narrative  . Not on file   Social Determinants of Health   Financial Resource Strain: Not on file  Food Insecurity: Not on file  Transportation Needs: Not on file   Physical Activity: Not on file  Stress: Not on file  Social Connections: Not on file    Allergies: No Known Allergies  Metabolic Disorder Labs: No results found for: "HGBA1C", "MPG" No results found for: "PROLACTIN" No results found for: "CHOL", "TRIG", "HDL", "CHOLHDL", "VLDL", "LDLCALC" No results found for: "TSH"  Therapeutic Level Labs: No results found for: "LITHIUM" No results found for: "VALPROATE" No results found for: "CBMZ"  Current Medications: Current Outpatient Medications  Medication Sig Dispense Refill  . buPROPion (WELLBUTRIN XL) 300 MG 24 hr tablet Take 1 tablet (300 mg total) by mouth every morning. 90 tablet 1  . ibuprofen (ADVIL,MOTRIN) 600 MG tablet Take 1 tablet (600 mg total) by mouth every 6 (six) hours as needed. 30 tablet 0  . lamoTRIgine (LAMICTAL) 100 MG tablet Take 1 tablet (100 mg total) by mouth daily. 90 tablet 2  . QUEtiapine (SEROQUEL) 100 MG tablet Take 1 tablet (100 mg total) by mouth at bedtime. 90 tablet 2  . sertraline (ZOLOFT) 100 MG tablet Take 1.5 tablets (150 mg total) by mouth daily. 180 tablet 2   No current facility-administered medications for this visit.     Musculoskeletal: Strength & Muscle Tone: na Gait & Station: na Patient leans: N/A  Psychiatric Specialty Exam: Review of Systems  All other systems reviewed and are negative.   There were no vitals taken for this visit.There is no height or weight on file to calculate BMI.  General Appearance: NA  Eye Contact:  NA  Speech:  Clear and Coherent  Volume:  Normal  Mood:  Euthymic  Affect:  Appropriate and Congruent  Thought Process:  Goal Directed  Orientation:  Full (Time, Place, and Person)  Thought Content: WDL   Suicidal Thoughts:  {ST/HT (PAA):22692}  Homicidal Thoughts:  {ST/HT (PAA):22692}  Memory:  {BHH MEMORY:22881}  Judgement:  {Judgement (PAA):22694}  Insight:  {Insight (PAA):22695}  Psychomotor Activity:  {Psychomotor (PAA):22696}  Concentration:   {Concentration:21399}  Recall:  {BHH GOOD/FAIR/POOR:22877}  Fund of Knowledge: {BHH GOOD/FAIR/POOR:22877}  Language: {BHH GOOD/FAIR/POOR:22877}  Akathisia:  {BHH YES OR NO:22294}  Handed:  {Handed:22697}  AIMS (if indicated): {Desc; done/not:10129}  Assets:  {Assets (PAA):22698}  ADL's:  {BHH HQP'R:91638}  Cognition: {chl bhh cognition:304700322}  Sleep:  {BHH GOOD/FAIR/POOR:22877}   Screenings: PHQ2-9    Flowsheet Row Video Visit from 01/30/2022 in [facility] Video Visit from 08/02/2021 in [facility]  PHQ-2 Total Score 0 0      Flowsheet Row Video Visit from 08/02/2021 in [facility] Video Visit from 09/26/2020 in [facility]  C-SSRS RISK CATEGORY No Risk No Risk        Assessment and Plan: ***  Collaboration of Care: Collaboration of Care: {BH OP  Collaboration of RUEA:54098119}  Patient/Guardian was advised Release of Information must be obtained prior to any record release in order to collaborate their care with an outside provider. Patient/Guardian was advised if they have not already done so to contact the registration department to sign all necessary forms in order for [place] to release information regarding their care.   Consent: Patient/Guardian gives verbal consent for treatment and assignment of benefits for services provided during this visit. Patient/Guardian expressed understanding and agreed to proceed.    Diannia Ruder, MD 01/30/2022, 2:56 PM

## 2022-02-20 ENCOUNTER — Encounter (HOSPITAL_COMMUNITY): Payer: Self-pay

## 2022-05-07 ENCOUNTER — Telehealth (HOSPITAL_COMMUNITY): Payer: 59 | Admitting: Psychiatry

## 2022-05-08 ENCOUNTER — Encounter (HOSPITAL_COMMUNITY): Payer: Self-pay

## 2022-06-26 ENCOUNTER — Encounter (HOSPITAL_COMMUNITY): Payer: Self-pay

## 2022-06-26 ENCOUNTER — Telehealth (HOSPITAL_COMMUNITY): Payer: 59 | Admitting: Psychiatry

## 2022-06-27 ENCOUNTER — Telehealth (INDEPENDENT_AMBULATORY_CARE_PROVIDER_SITE_OTHER): Payer: 59 | Admitting: Psychiatry

## 2022-06-27 ENCOUNTER — Encounter (HOSPITAL_COMMUNITY): Payer: Self-pay | Admitting: Psychiatry

## 2022-06-27 DIAGNOSIS — F331 Major depressive disorder, recurrent, moderate: Secondary | ICD-10-CM | POA: Diagnosis not present

## 2022-06-27 DIAGNOSIS — F4310 Post-traumatic stress disorder, unspecified: Secondary | ICD-10-CM | POA: Diagnosis not present

## 2022-06-27 MED ORDER — QUETIAPINE FUMARATE 100 MG PO TABS: 100.0000 mg | ORAL_TABLET | Freq: Every day | ORAL | 2 refills | Status: DC

## 2022-06-27 MED ORDER — SERTRALINE HCL 100 MG PO TABS: 150.0000 mg | ORAL_TABLET | Freq: Every day | ORAL | 2 refills | Status: DC

## 2022-06-27 MED ORDER — BUPROPION HCL ER (XL) 300 MG PO TB24: 300.0000 mg | ORAL_TABLET | ORAL | 1 refills | Status: DC

## 2022-06-27 MED ORDER — LAMOTRIGINE 100 MG PO TABS: 100.0000 mg | ORAL_TABLET | Freq: Every day | ORAL | 2 refills | Status: DC

## 2022-06-27 NOTE — Progress Notes (Signed)
Virtual Visit via Video Note  I connected with [facility] on 06/27/22 at  1:40 PM EST by a video enabled telemedicine application and verified that I am speaking with the correct person using two identifiers.  Location: Patient: home Provider: office   I discussed the limitations of evaluation and management by telemedicine and the availability of in person appointments. The patient expressed understanding and agreed to proceed.     I discussed the assessment and treatment plan with the patient. The patient was provided an opportunity to ask questions and all were answered. The patient agreed with the plan and demonstrated an understanding of the instructions.   The patient was advised to call back or seek an in-person evaluation if the symptoms worsen or if the condition fails to improve as anticipated.  I provided 20 minutes of non-face-to-face time during this encounter.   Diannia Ruder, MD  [facility] MD/PA/NP OP Progress Note  06/27/2022 1:52 PM Judith Cole  MRN:  [id number]  Chief Complaint:  Chief Complaint  Patient presents with   Depression   Anxiety   Follow-up   HPI: This patient is a 22 year old female who lives with her boyfriend in [place]. She is currently unemployed   The patient returns for follow-up after about 5 months.  This is regarding her depression mood swings and PTSD.  She states for a while her 72 year old sister has been living with her but the sister was out of control unmedicated and using substances and she finally asked her to leave.  She states that this was very stressful.  She states that the sister is now gone back to [place] and claims that she is going into rehab.  The patient states that she has to distance herself from the sister as it causes her a great deal of stress.  The patient overall has been doing well.  She is spending a lot of time cleaning cooking and caring for the animals at home.  Her mood has been stable.  She denies significant  depression manic symptoms difficulty sleeping thoughts of self-harm or hallucinations nightmares or flashbacks.  She is doing well on the lower dosage of Zoloft. Visit Diagnosis:    ICD-10-CM   1. Major depressive disorder, recurrent episode, moderate with anxious distress (HCC)  F33.1     2. PTSD (post-traumatic stress disorder)  F43.10       Past Psychiatric History: Past hospitalizations and 1 stay in a PT RF as a teenager.  She [facility]  Past Medical History:  Past Medical History:  Diagnosis Date   ADHD (attention deficit hyperactivity disorder)    Depression    PTSD (post-traumatic stress disorder)    History reviewed. No pertinent surgical history.  Family Psychiatric History: See below  Family History:  Family History  Problem Relation Age of Onset   Drug abuse Mother    Bipolar disorder Mother    Drug abuse Father    Eating disorder Sister    Drug abuse Sister    Drug abuse Sister     Social History:  Social History   Socioeconomic History   Marital status: Single    Spouse name: Not on file   Number of children: Not on file   Years of education: Not on file   Highest education level: Not on file  Occupational History   Not on file  Tobacco Use   Smoking status: Never   Smokeless tobacco: Never  Vaping Use  Vaping Use: Every day  Substance and Sexual Activity   Alcohol use: Yes    Comment: social   Drug use: Yes    Frequency: 1.0 times per week    Types: Marijuana    Comment: smokes in the evenings   Sexual activity: Yes    Birth control/protection: Inserts  Other Topics Concern   Not on file  Social History Narrative   Not on file   Social Determinants of Health   Financial Resource Strain: Not on file  Food Insecurity: Not on file  Transportation Needs: Not on file  Physical Activity: Not on file  Stress: Not on file  Social Connections: Not on file    Allergies: No Known Allergies  Metabolic  Disorder Labs: No results found for: "HGBA1C", "MPG" No results found for: "PROLACTIN" No results found for: "CHOL", "TRIG", "HDL", "CHOLHDL", "VLDL", "LDLCALC" No results found for: "TSH"  Therapeutic Level Labs: No results found for: "LITHIUM" No results found for: "VALPROATE" No results found for: "CBMZ"  Current Medications: Current Outpatient Medications  Medication Sig Dispense Refill   buPROPion (WELLBUTRIN XL) 300 MG 24 hr tablet Take 1 tablet (300 mg total) by mouth every morning. 90 tablet 1   lamoTRIgine (LAMICTAL) 100 MG tablet Take 1 tablet (100 mg total) by mouth daily. 90 tablet 2   QUEtiapine (SEROQUEL) 100 MG tablet Take 1 tablet (100 mg total) by mouth at bedtime. 90 tablet 2   sertraline (ZOLOFT) 100 MG tablet Take 1.5 tablets (150 mg total) by mouth daily. 180 tablet 2   No current facility-administered medications for this visit.     Musculoskeletal: Strength & Muscle Tone: within normal limits Gait & Station: normal Patient leans: N/A  Psychiatric Specialty Exam: Review of Systems  All other systems reviewed and are negative.   There were no vitals taken for this visit.There is no height or weight on file to calculate BMI.  General Appearance: Casual and Fairly Groomed  Eye Contact:  Good  Speech:  Clear and Coherent  Volume:  Normal  Mood:  Euthymic  Affect:  Appropriate and Congruent  Thought Process:  Goal Directed  Orientation:  Full (Time, Place, and Person)  Thought Content: WDL   Suicidal Thoughts:  No  Homicidal Thoughts:  No  Memory:  Immediate;   Good Recent;   Good Remote;   Good  Judgement:  Good  Insight:  Fair  Psychomotor Activity:  Normal  Concentration:  Concentration: Good and Attention Span: Good  Recall:  Good  Fund of Knowledge: Good  Language: Good  Akathisia:  No  Handed:  Right  AIMS (if indicated): not done  Assets:  Communication Skills Desire for Improvement Physical Health Resilience Social  Support Talents/Skills  ADL's:  Intact  Cognition: WNL  Sleep:  Good   Screenings: PHQ2-9    Flowsheet Row Video Visit from 01/30/2022 in [facility] Video Visit from 08/02/2021 in [facility]  PHQ-2 Total Score 0 0      Flowsheet Row Video Visit from 08/02/2021 in [facility] Video Visit from 09/26/2020 in [place] No Risk No Risk        Assessment and Plan: This patient has a history of depression anxiety and PTSD.  She continues to do well on her current regimen.  She will continue Wellbutrin XL 300 mg daily for depression, Zoloft 150 mg daily for depression, Lamictal 100 mg daily  for mood stabilization and Seroquel 100 mg at bedtime for mood stabilization.  She will return to see me in 3 months  Collaboration of Care: Collaboration of Care: Primary Care Provider AEB notes will be shared with PCP at patient's request  Patient/Guardian was advised Release of Information must be obtained prior to any record release in order to collaborate their care with an outside provider. Patient/Guardian was advised if they have not already done so to contact the registration department to sign all necessary forms in order for [place] to release information regarding their care.   Consent: Patient/Guardian gives verbal consent for treatment and assignment of benefits for services provided during this visit. Patient/Guardian expressed understanding and agreed to proceed.    Levonne Spiller, MD 06/27/2022, 1:52 PM

## 2022-09-18 ENCOUNTER — Telehealth (HOSPITAL_COMMUNITY): Payer: 59 | Admitting: Psychiatry

## 2022-09-25 ENCOUNTER — Encounter (HOSPITAL_COMMUNITY): Payer: Self-pay | Admitting: Psychiatry

## 2022-09-25 ENCOUNTER — Telehealth (INDEPENDENT_AMBULATORY_CARE_PROVIDER_SITE_OTHER): Payer: 59 | Admitting: Psychiatry

## 2022-09-25 DIAGNOSIS — F331 Major depressive disorder, recurrent, moderate: Secondary | ICD-10-CM | POA: Diagnosis not present

## 2022-09-25 DIAGNOSIS — F4310 Post-traumatic stress disorder, unspecified: Secondary | ICD-10-CM | POA: Diagnosis not present

## 2022-09-25 MED ORDER — BUPROPION HCL ER (XL) 300 MG PO TB24: 300.0000 mg | ORAL_TABLET | ORAL | 1 refills | Status: DC

## 2022-09-25 MED ORDER — SERTRALINE HCL 100 MG PO TABS: 100.0000 mg | ORAL_TABLET | Freq: Every day | ORAL | 2 refills | Status: DC

## 2022-09-25 MED ORDER — LAMOTRIGINE 100 MG PO TABS: 100.0000 mg | ORAL_TABLET | Freq: Every day | ORAL | 2 refills | Status: DC

## 2022-09-25 MED ORDER — QUETIAPINE FUMARATE 100 MG PO TABS: 100.0000 mg | ORAL_TABLET | Freq: Every day | ORAL | 2 refills | Status: DC

## 2022-09-25 NOTE — Progress Notes (Signed)
Virtual Visit via Video Note  I connected with [facility] on 09/25/22 at  2:00 PM EDT by a video enabled telemedicine application and verified that I am speaking with the correct person using two identifiers.  Location: Patient: home Provider: office   I discussed the limitations of evaluation and management by telemedicine and the availability of in person appointments. The patient expressed understanding and agreed to proceed.     I discussed the assessment and treatment plan with the patient. The patient was provided an opportunity to ask questions and all were answered. The patient agreed with the plan and demonstrated an understanding of the instructions.   The patient was advised to call back or seek an in-person evaluation if the symptoms worsen or if the condition fails to improve as anticipated.  I provided 15 minutes of non-face-to-face time during this encounter.   Diannia Ruder, MD  [facility] MD/PA/NP OP Progress Note  09/25/2022 2:02 PM Judith Cole  MRN:  [id number]  Chief Complaint:  Chief Complaint  Patient presents with   Depression   Manic Behavior   Follow-up   HPI: This patient is a 22 year old female who lives with her fianc in [place].  She is currently unemployed.  The patient returns for follow-up after 3 months.  For the most part she is doing well.  She denies significant depression anxiety or mood swings.  She states that her life has been fairly calm.  She is spending most of her time taking care of the home and caring for the animals.  She is planning her wedding for next January at her parents house.  She is sleeping well and denies any thoughts of self-harm or suicide.  She asked if we can continue to cut down the Zoloft and I think this is reasonable. Visit Diagnosis:    ICD-10-CM   1. Major depressive disorder, recurrent episode, moderate with anxious distress  F33.1     2. PTSD (post-traumatic stress disorder)  F43.10       Past  Psychiatric History: Past hospitalizations and 1 stay in a PT RF as a teenager. She [facility]   Past Medical History:  Past Medical History:  Diagnosis Date   ADHD (attention deficit hyperactivity disorder)    Depression    PTSD (post-traumatic stress disorder)    History reviewed. No pertinent surgical history.  Family Psychiatric History: See below  Family History:  Family History  Problem Relation Age of Onset   Drug abuse Mother    Bipolar disorder Mother    Drug abuse Father    Eating disorder Sister    Drug abuse Sister    Drug abuse Sister     Social History:  Social History   Socioeconomic History   Marital status: Single    Spouse name: Not on file   Number of children: Not on file   Years of education: Not on file   Highest education level: Not on file  Occupational History   Not on file  Tobacco Use   Smoking status: Never   Smokeless tobacco: Never  Vaping Use   Vaping Use: Every day  Substance and Sexual Activity   Alcohol use: Yes    Comment: social   Drug use: Yes    Frequency: 1.0 times per week    Types: Marijuana    Comment: smokes in the evenings   Sexual activity: Yes    Birth control/protection: Inserts  Other Topics Concern  Not on file  Social History Narrative   Not on file   Social Determinants of Health   Financial Resource Strain: Not on file  Food Insecurity: Not on file  Transportation Needs: Not on file  Physical Activity: Not on file  Stress: Not on file  Social Connections: Not on file    Allergies: No Known Allergies  Metabolic Disorder Labs: No results found for: "HGBA1C", "MPG" No results found for: "PROLACTIN" No results found for: "CHOL", "TRIG", "HDL", "CHOLHDL", "VLDL", "LDLCALC" No results found for: "TSH"  Therapeutic Level Labs: No results found for: "LITHIUM" No results found for: "VALPROATE" No results found for: "CBMZ"  Current Medications: Current Outpatient  Medications  Medication Sig Dispense Refill   buPROPion (WELLBUTRIN XL) 300 MG 24 hr tablet Take 1 tablet (300 mg total) by mouth every morning. 90 tablet 1   lamoTRIgine (LAMICTAL) 100 MG tablet Take 1 tablet (100 mg total) by mouth daily. 90 tablet 2   QUEtiapine (SEROQUEL) 100 MG tablet Take 1 tablet (100 mg total) by mouth at bedtime. 90 tablet 2   sertraline (ZOLOFT) 100 MG tablet Take 1 tablet (100 mg total) by mouth daily. 90 tablet 2   No current facility-administered medications for this visit.     Musculoskeletal: Strength & Muscle Tone: within normal limits Gait & Station: normal Patient leans: N/A  Psychiatric Specialty Exam: Review of Systems  All other systems reviewed and are negative.   There were no vitals taken for this visit.There is no height or weight on file to calculate BMI.  General Appearance: Casual and Fairly Groomed  Eye Contact:  Good  Speech:  Clear and Coherent  Volume:  Normal  Mood:  Euthymic  Affect:  Congruent  Thought Process:  Goal Directed  Orientation:  Full (Time, Place, and Person)  Thought Content: WDL   Suicidal Thoughts:  No  Homicidal Thoughts:  No  Memory:  Immediate;   Good Recent;   Good Remote;   NA  Judgement:  Good  Insight:  Good  Psychomotor Activity:  Normal  Concentration:  Concentration: Good and Attention Span: Good  Recall:  Good  Fund of Knowledge: Good  Language: Good  Akathisia:  No  Handed:  Right  AIMS (if indicated): not done  Assets:  Communication Skills Desire for Improvement Physical Health Resilience Social Support Talents/Skills  ADL's:  Intact  Cognition: WNL  Sleep:  Good   Screenings: PHQ2-9    Flowsheet Row [facility] Video [facility]  PHQ-2 Total Score 0 0      Flowsheet Row Video [facility] [place] Video Visit [facility]/2020 [facility] [place]  C-SSRS RISK CATEGORY No Risk No Risk        Assessment and Plan:  This patient is a 22 year old female with a history of depression anxiety and PTSD.  She is doing well on her regimen and thinks we can continue to go down on the Zoloft.  Since she is at 150 mg daily we will go down to 100 mg daily.  She will continue Wellbutrin XL 300 mg daily for depression, Lamictal 100 mg daily for mood stabilization and Seroquel 100 mg at bedtime for mood stabilization.  She will return to see me in 3 months Collaboration of Care: Collaboration of Care: Primary Care Provider AEB  notes will be shared with PCP at patient's request  Patient/Guardian was advised Release of Information must be obtained prior to any record release in order to collaborate their care with an outside provider. Patient/Guardian was advised if they have not already done so to contact the registration department to sign all necessary forms in order for [place] to release information regarding their care.   Consent: Patient/Guardian gives verbal consent for treatment and assignment of benefits for services provided during this visit. Patient/Guardian expressed understanding and agreed to proceed.    Diannia Ruder, MD 09/25/2022, 2:02 PM

## 2022-12-18 ENCOUNTER — Telehealth (INDEPENDENT_AMBULATORY_CARE_PROVIDER_SITE_OTHER): Payer: 59 | Admitting: Psychiatry

## 2022-12-18 ENCOUNTER — Encounter (HOSPITAL_COMMUNITY): Payer: Self-pay | Admitting: Psychiatry

## 2022-12-18 DIAGNOSIS — F4310 Post-traumatic stress disorder, unspecified: Secondary | ICD-10-CM

## 2022-12-18 DIAGNOSIS — F331 Major depressive disorder, recurrent, moderate: Secondary | ICD-10-CM | POA: Diagnosis not present

## 2022-12-18 MED ORDER — BUPROPION HCL ER (XL) 300 MG PO TB24: 300.0000 mg | ORAL_TABLET | ORAL | 1 refills | Status: DC

## 2022-12-18 MED ORDER — QUETIAPINE FUMARATE 100 MG PO TABS: 100.0000 mg | ORAL_TABLET | Freq: Every day | ORAL | 2 refills | Status: DC

## 2022-12-18 MED ORDER — SERTRALINE HCL 50 MG PO TABS: 50.0000 mg | ORAL_TABLET | Freq: Every day | ORAL | 2 refills | Status: DC

## 2022-12-18 MED ORDER — LAMOTRIGINE 100 MG PO TABS: 100.0000 mg | ORAL_TABLET | Freq: Every day | ORAL | 2 refills | Status: DC

## 2022-12-18 NOTE — Progress Notes (Signed)
Virtual Visit via Video Note  I connected with [facility] on 12/18/22 at  1:40 PM EDT by a video enabled telemedicine application and verified that I am speaking with the correct person using two identifiers.  Location: Patient: home Provider: office   I discussed the limitations of evaluation and management by telemedicine and the availability of in person appointments. The patient expressed understanding and agreed to proceed.     I discussed the assessment and treatment plan with the patient. The patient was provided an opportunity to ask questions and all were answered. The patient agreed with the plan and demonstrated an understanding of the instructions.   The patient was advised to call back or seek an in-person evaluation if the symptoms worsen or if the condition fails to improve as anticipated.  I provided 15 minutes of non-face-to-face time during this encounter.   Diannia Ruder, MD  [facility] MD/PA/NP OP Progress Note  12/18/2022 2:01 PM Judith Cole  MRN:  [id number]  Chief Complaint:  Chief Complaint  Patient presents with   Anxiety   Depression   Manic Behavior   Follow-up   HPI: This patient is a 22 year old white female who lives with her fianc in [place].  She is currently working as a Futures trader.  The patient returns for follow-up after 3 months.  She states that she is doing well.  She denies significant depression anxiety or mood swings.  She denies thoughts of self-harm.  She is sleeping well.  She is spending most of her time taking care of the plants and animals in her home.  She still wants to continue to cut down the Zoloft because she does not like the side effects and no longer feels depressed.  She is currently on 100 mg we can cut it down to 50.  She still drinks alcohol but in much greater moderation. Visit Diagnosis:    ICD-10-CM   1. Major depressive disorder, recurrent episode, moderate with anxious distress (HCC)  F33.1     2. PTSD  (post-traumatic stress disorder)  F43.10       Past Psychiatric History: Past hospitalizations and 1 stay in a PT RF as a teenager. [facility]   Past Medical History:  Past Medical History:  Diagnosis Date   ADHD (attention deficit hyperactivity disorder)    Depression    PTSD (post-traumatic stress disorder)    History reviewed. No pertinent surgical history.  Family Psychiatric History: See below  Family History:  Family History  Problem Relation Age of Onset   Drug abuse Mother    Bipolar disorder Mother    Drug abuse Father    Eating disorder Sister    Drug abuse Sister    Drug abuse Sister     Social History:  Social History   Socioeconomic History   Marital status: Single    Spouse name: Not on file   Number of children: Not on file   Years of education: Not on file   Highest education level: Not on file  Occupational History   Not on file  Tobacco Use   Smoking status: Never   Smokeless tobacco: Never  Vaping Use   Vaping Use: Every day  Substance and Sexual Activity   Alcohol use: Yes    Comment: social   Drug use: Yes    Frequency: 1.0 times per week    Types: Marijuana    Comment: smokes in the evenings   Sexual activity: Yes  Birth control/protection: Inserts  Other Topics Concern   Not on file  Social History Narrative   Not on file   Social Determinants of Health   Financial Resource Strain: Not on file  Food Insecurity: Not on file  Transportation Needs: Not on file  Physical Activity: Not on file  Stress: Not on file  Social Connections: Not on file    Allergies: No Known Allergies  Metabolic Disorder Labs: No results found for: "HGBA1C", "MPG" No results found for: "PROLACTIN" No results found for: "CHOL", "TRIG", "HDL", "CHOLHDL", "VLDL", "LDLCALC" No results found for: "TSH"  Therapeutic Level Labs: No results found for: "LITHIUM" No results found for: "VALPROATE" No results found  for: "CBMZ"  Current Medications: Current Outpatient Medications  Medication Sig Dispense Refill   sertraline (ZOLOFT) 50 MG tablet Take 1 tablet (50 mg total) by mouth daily. 30 tablet 2   buPROPion (WELLBUTRIN XL) 300 MG 24 hr tablet Take 1 tablet (300 mg total) by mouth every morning. 90 tablet 1   lamoTRIgine (LAMICTAL) 100 MG tablet Take 1 tablet (100 mg total) by mouth daily. 90 tablet 2   QUEtiapine (SEROQUEL) 100 MG tablet Take 1 tablet (100 mg total) by mouth at bedtime. 90 tablet 2   No current facility-administered medications for this visit.     Musculoskeletal: Strength & Muscle Tone: within normal limits Gait & Station: normal Patient leans: N/A  Psychiatric Specialty Exam: Review of Systems  All other systems reviewed and are negative.   There were no vitals taken for this visit.There is no height or weight on file to calculate BMI.  General Appearance: Casual and Fairly Groomed  Eye Contact:  Good  Speech:  Clear and Coherent  Volume:  Normal  Mood:  Euthymic  Affect:  Congruent  Thought Process:  Goal Directed  Orientation:  Full (Time, Place, and Person)  Thought Content: WDL   Suicidal Thoughts:  No  Homicidal Thoughts:  No  Memory:  Immediate;   Good Recent;   Good Remote;   Good  Judgement:  Good  Insight:  Good  Psychomotor Activity:  Normal  Concentration:  Concentration: Good and Attention Span: Good  Recall:  Good  Fund of Knowledge: Good  Language: Good  Akathisia:  No  Handed:  Right  AIMS (if indicated): not done  Assets:  Communication Skills Desire for Improvement Physical Health Resilience Social Support Talents/Skills  ADL's:  Intact  Cognition: WNL  Sleep:  Good   Screenings: PHQ2-9    Flowsheet Row Video Visit from 01/30/2022 in [facility] [place] Video Visit from 08/02/2021 in [facility] Total Score 0 0      Flowsheet Row Video Visit  from 08/02/2021 in [facility] [place] Video Visit from 09/26/2020 in [facility] [place]  C-SSRS RISK CATEGORY No Risk No Risk        Assessment and Plan: This patient is a 22 year old female with a history depression anxiety and PTSD.  We will continue to cut down the Zoloft at her request.  We will go down to 50 mg daily.  She will also continue Wellbutrin XL 300 mg daily for depression and sexual side effects, Lamictal 100 mg daily for mood stabilization and Seroquel 100 mg at bedtime also for mood stabilization.  She will return to see me in 3 months  Collaboration of Care: Collaboration of Care: Primary Care Provider AEB  notes will be shared with PCP at patient's request  Patient/Guardian was advised Release of Information must be obtained prior to any record release in order to collaborate their care with an outside provider. Patient/Guardian was advised if they have not already done so to contact the registration department to sign all necessary forms in order for [place] to release information regarding their care.   Consent: Patient/Guardian gives verbal consent for treatment and assignment of benefits for services provided during this visit. Patient/Guardian expressed understanding and agreed to proceed.    Diannia Ruder, MD 12/18/2022, 2:01 PM

## 2023-03-12 ENCOUNTER — Encounter (HOSPITAL_COMMUNITY): Payer: Self-pay | Admitting: Emergency Medicine

## 2023-03-12 ENCOUNTER — Other Ambulatory Visit: Payer: Self-pay

## 2023-03-12 ENCOUNTER — Emergency Department (HOSPITAL_COMMUNITY)
Admission: EM | Admit: 2023-03-12 | Discharge: 2023-03-12 | Disposition: A | Discharge status: Home / Self Care | Payer: 59 | Attending: Emergency Medicine | Admitting: Emergency Medicine

## 2023-03-12 ENCOUNTER — Emergency Department (HOSPITAL_COMMUNITY): Payer: 59

## 2023-03-12 DIAGNOSIS — Y9301 Activity, walking, marching and hiking: Secondary | ICD-10-CM | POA: Insufficient documentation

## 2023-03-12 DIAGNOSIS — S91331A Puncture wound without foreign body, right foot, initial encounter: Secondary | ICD-10-CM | POA: Diagnosis not present

## 2023-03-12 DIAGNOSIS — W450XXA Nail entering through skin, initial encounter: Secondary | ICD-10-CM | POA: Insufficient documentation

## 2023-03-12 DIAGNOSIS — Z23 Encounter for immunization: Secondary | ICD-10-CM | POA: Diagnosis not present

## 2023-03-12 DIAGNOSIS — S99921A Unspecified injury of right foot, initial encounter: Secondary | ICD-10-CM | POA: Diagnosis present

## 2023-03-12 DIAGNOSIS — T148XXA Other injury of unspecified body region, initial encounter: Secondary | ICD-10-CM

## 2023-03-12 MED ORDER — CIPROFLOXACIN HCL 750 MG PO TABS: 750.0000 mg | ORAL_TABLET | Freq: Two times a day (BID) | ORAL | 0 refills | Status: AC

## 2023-03-12 MED ORDER — CEPHALEXIN 500 MG PO CAPS: 500.0000 mg | ORAL_CAPSULE | Freq: Two times a day (BID) | ORAL | 0 refills | Status: AC

## 2023-03-12 MED ORDER — TETANUS-DIPHTH-ACELL PERTUSSIS 5-2.5-18.5 LF-MCG/0.5 IM SUSY
0.5000 mL | PREFILLED_SYRINGE | Freq: Once | INTRAMUSCULAR | Status: AC
  Administered 2023-03-12: 0.5 mL via INTRAMUSCULAR
  Filled 2023-03-12: qty 0.5

## 2023-03-12 NOTE — ED Provider Notes (Signed)
[place] EMERGENCY DEPARTMENT AT [facility] Provider Note   CSN: [id number] Arrival date & time: 03/12/23  1947     History  Chief Complaint  Patient presents with   Foot Injury    Judith Cole is a 22 y.o. female.   Foot Injury Patient presents for foot injury.  Medical history includes depression, ADHD, PTSD.  Earlier today, patient was walking in her chicken coop.  At the time, she was wearing flip-flops sandals.  A nail pierced the sole of her flip-flops and penetrated the heel of her right foot.  She has had mild pain to the area since that time.  She is unaware of when her last tetanus shot was.     Home Medications Prior to Admission medications   Medication Sig Start Date End Date Taking? Authorizing Provider  cephALEXin (KEFLEX) 500 MG capsule Take 1 capsule (500 mg total) by mouth 2 (two) times daily for 5 days. 03/12/23 03/17/23 Yes Gloris Manchester, MD  ciprofloxacin (CIPRO) 750 MG tablet Take 1 tablet (750 mg total) by mouth 2 (two) times daily for 5 days. 03/12/23 03/17/23 Yes Gloris Manchester, MD  buPROPion (WELLBUTRIN XL) 300 MG 24 hr tablet Take 1 tablet (300 mg total) by mouth every morning. 12/18/22 12/18/23  Myrlene Broker, MD  lamoTRIgine (LAMICTAL) 100 MG tablet Take 1 tablet (100 mg total) by mouth daily. 12/18/22 12/18/23  Myrlene Broker, MD  QUEtiapine (SEROQUEL) 100 MG tablet Take 1 tablet (100 mg total) by mouth at bedtime. 12/18/22 12/18/23  Myrlene Broker, MD  sertraline (ZOLOFT) 50 MG tablet Take 1 tablet (50 mg total) by mouth daily. 12/18/22 12/18/23  Myrlene Broker, MD      Allergies    Patient has no known allergies.    Review of Systems   Review of Systems  Skin:  Positive for wound.  All other systems reviewed and are negative.   Physical Exam Updated Vital Signs BP 138/83 (BP Location: Right Arm)   Pulse 96   Temp 97.8 F (36.6 C) (Oral)   Resp 16   Ht 5\' 6"  (1.676 m)   Wt 73 kg   SpO2 100%   BMI 25.98 kg/m  Physical Exam Vitals and  nursing note reviewed.  Constitutional:      General: She is not in acute distress.    Appearance: Normal appearance. She is well-developed and normal weight. She is not ill-appearing, toxic-appearing or diaphoretic.  HENT:     Head: Normocephalic and atraumatic.     Right Ear: External ear normal.     Left Ear: External ear normal.     Nose: Nose normal.     Mouth/Throat:     Mouth: Mucous membranes are moist.  Eyes:     Extraocular Movements: Extraocular movements intact.     Conjunctiva/sclera: Conjunctivae normal.  Cardiovascular:     Rate and Rhythm: Normal rate and regular rhythm.  Pulmonary:     Effort: Pulmonary effort is normal. No respiratory distress.  Abdominal:     General: There is no distension.     Palpations: Abdomen is soft.  Musculoskeletal:        General: Tenderness and signs of injury present. No swelling or deformity. Normal range of motion.     Cervical back: Normal range of motion and neck supple.     Comments: Punctate wound to heel of right foot  Skin:    General: Skin is warm and dry.     Coloration:  Skin is not jaundiced or pale.  Neurological:     General: No focal deficit present.     Mental Status: She is alert and oriented to person, place, and time.  Psychiatric:        Mood and Affect: Mood normal.        Behavior: Behavior normal.     ED Results / Procedures / Treatments   Labs (all labs ordered are listed, but only abnormal results are displayed) Labs Reviewed - No data to display  EKG None  Radiology DG Foot 2 Views Right  Result Date: 03/12/2023 CLINICAL DATA:  Stepped on a nail. EXAM: RIGHT FOOT - 2 VIEW COMPARISON:  None Available. FINDINGS: There is no evidence of fracture or dislocation. There is no evidence of arthropathy or other focal bone abnormality. Soft tissues are unremarkable. No foreign body identified. IMPRESSION: Negative. Electronically Signed   By: Darliss Cheney M.D.   On: 03/12/2023 23:05   DG Finger Thumb  Left  Result Date: 03/12/2023 CLINICAL DATA:  possible foreign body stepping on nail while in the chicken coop this afternoon. States nail went into her heel. Doesn't know when her last Tdap was. Also having left thumb pain from when she was 14 she stuck staples in her hand. EXAM: LEFT THUMB 2+V COMPARISON:  None Available. FINDINGS: There is no evidence of fracture or dislocation. There is no evidence of arthropathy or other focal bone abnormality. Soft tissues are unremarkable. Approximately 14 mm curvilinear metallic density along the first digit metacarpophalangeal joint that is likely embedded within the base of the first digit proximal phalanx and head of the first digit metacarpal. Second curvilinear metallic density measuring approximately 10 mm overlying the inter web between the first and second digit. IMPRESSION: 1. No acute displaced fracture or dislocation. 2. Approximately 14 mm curvilinear metallic density along the first digit metacarpophalangeal joint that is likely embedded within the base of the first digit proximal phalanx and head of the first digit metacarpal. 3. Approximately 10 mm curvilinear metallic density overlying the first second digit inter web soft tissues. Electronically Signed   By: Tish Frederickson M.D.   On: 03/12/2023 21:39   DG Ankle Complete Right  Result Date: 03/12/2023 CLINICAL DATA:  Stepped on. EXAM: RIGHT ANKLE - COMPLETE 3+ VIEW COMPARISON:  Right ankle x-ray 11/19/2017 FINDINGS: There is no evidence of fracture, dislocation, or joint effusion. There is no evidence of arthropathy or other focal bone abnormality. Soft tissues are unremarkable. No foreign body identified. IMPRESSION: Negative. Electronically Signed   By: Darliss Cheney M.D.   On: 03/12/2023 21:35    Procedures Procedures    Medications Ordered in ED Medications  Tdap (BOOSTRIX) injection 0.5 mL (0.5 mLs Intramuscular Given 03/12/23 2222)    ED Course/ Medical Decision Making/ A&P                                  Medical Decision Making Amount and/or Complexity of Data Reviewed Radiology: ordered.  Risk Prescription drug management.   Patient presents for right foot wound.  Earlier today, patient stepped on a nail.  This nail pierced the sole of her flip-flop sandal.  She sustained a small wound to the heel of her right foot.  Vital signs on arrival are normal.  Patient is well-appearing on exam.  X-ray imaging was obtained to assess for presence of foreign bodies.  Patient additionally states that she has  old foreign bodies in her left hand from 8 years ago.  She did request imaging of this as well.  And imaging did show what appear to be staples in her right hand.  On exam, right hand is normal in appearance.  Given the timeline, these are to remain in place.  X-ray of foot showed no foreign bodies and no osseous involvement.  Patient's tetanus was updated.  Given that nail penetrated sole of shoe, will cover for Pseudomonas.  Patient was prescribed Keflex and ciprofloxacin.  She was discharged in stable condition.        Final Clinical Impression(s) / ED Diagnoses Final diagnoses:  Puncture wound    Rx / DC Orders ED Discharge Orders          Ordered    cephALEXin (KEFLEX) 500 MG capsule  2 times daily        03/12/23 2251    ciprofloxacin (CIPRO) 750 MG tablet  2 times daily        03/12/23 2251              Gloris Manchester, MD 03/12/23 2311

## 2023-03-12 NOTE — ED Triage Notes (Signed)
Pt c/o stepping on nail while in the chicken coop this afternoon. States nail went into her heel. Doesn't know when her last Tdap was.

## 2023-03-12 NOTE — Discharge Instructions (Addendum)
Prescriptions for antibiotics were sent to your pharmacy.  Take as prescribed.  Elevate foot when possible.  Keep wound clean and dry.  Hypertonic Epsom soaks can help minimize swelling.  Return to the emergency department if area becomes increasingly red, swollen, painful, or begins draining pus.

## 2023-03-12 NOTE — ED Notes (Signed)
Discharge instructions reviewed with patient.   Opportunity for questions and concerns provided.   Pharmacy verified and newly prescribed medications discussed.

## 2023-03-13 ENCOUNTER — Telehealth (HOSPITAL_COMMUNITY): Payer: 59 | Admitting: Psychiatry

## 2023-03-13 ENCOUNTER — Encounter (HOSPITAL_COMMUNITY): Payer: Self-pay | Admitting: Psychiatry

## 2023-03-13 DIAGNOSIS — F4310 Post-traumatic stress disorder, unspecified: Secondary | ICD-10-CM | POA: Diagnosis not present

## 2023-03-13 DIAGNOSIS — F331 Major depressive disorder, recurrent, moderate: Secondary | ICD-10-CM

## 2023-03-13 MED ORDER — LAMOTRIGINE 100 MG PO TABS: 100.0000 mg | ORAL_TABLET | Freq: Two times a day (BID) | ORAL | 2 refills | Status: DC

## 2023-03-13 MED ORDER — SERTRALINE HCL 50 MG PO TABS: 50.0000 mg | ORAL_TABLET | Freq: Every day | ORAL | 2 refills | Status: DC

## 2023-03-13 MED ORDER — QUETIAPINE FUMARATE 100 MG PO TABS: 100.0000 mg | ORAL_TABLET | Freq: Every day | ORAL | 2 refills | Status: DC

## 2023-03-13 MED ORDER — BUPROPION HCL ER (XL) 300 MG PO TB24: 300.0000 mg | ORAL_TABLET | ORAL | 1 refills | Status: DC

## 2023-03-13 NOTE — Progress Notes (Signed)
Virtual Visit via Video Note  I connected with [facility] on 03/13/23 at  1:20 PM EDT by a video enabled telemedicine application and verified that I am speaking with the correct person using two identifiers.  Location: Patient: home Provider: office   I discussed the limitations of evaluation and management by telemedicine and the availability of in person appointments. The patient expressed understanding and agreed to proceed.    I discussed the assessment and treatment plan with the patient. The patient was provided an opportunity to ask questions and all were answered. The patient agreed with the plan and demonstrated an understanding of the instructions.   The patient was advised to call back or seek an in-person evaluation if the symptoms worsen or if the condition fails to improve as anticipated.  I provided 15 minutes of non-face-to-face time during this encounter.   Diannia Ruder, MD  [facility] MD/PA/NP OP Progress Note  03/13/2023 1:43 PM Judith Cole  MRN:  [id number]  Chief Complaint:  Chief Complaint  Patient presents with   Depression   Anxiety   Follow-up   HPI: This patient is a 22 year old white female who lives with her fianc in [place]. She is currently working as a Futures trader.   The patient returns for follow-up after 3 months.  Last time we had cut the Zoloft down to 50 mg.  She had hoped to get off it but she does not feel ready yet.  She still thinks that that it makes her a bit sluggish.  She does not have a lot of motivation.  She did not do well in the summer with the heat.  She states that she does just enough to keep the house going.  She would like to have more energy to do projects.  She asked if we can try going up on the Lamictal and we certainly can try it although I am not sure we will definitely give her an energy boost.  I urged her to get more organized and to set up a schedule for herself.  She is sleeping well.  She denies any thoughts of  self-harm or suicide.  She is not drinking very much Visit Diagnosis:    ICD-10-CM   1. Major depressive disorder, recurrent episode, moderate with anxious distress (HCC)  F33.1     2. PTSD (post-traumatic stress disorder)  F43.10       Past Psychiatric History:  Past hospitalizations and 1 stay in a PT RF as a teenager. She [facility]   Past Medical History:  Past Medical History:  Diagnosis Date   ADHD (attention deficit hyperactivity disorder)    Depression    PTSD (post-traumatic stress disorder)    History reviewed. No pertinent surgical history.  Family Psychiatric History: See below  Family History:  Family History  Problem Relation Age of Onset   Drug abuse Mother    Bipolar disorder Mother    Drug abuse Father    Eating disorder Sister    Drug abuse Sister    Drug abuse Sister     Social History:  Social History   Socioeconomic History   Marital status: Single    Spouse name: Not on file   Number of children: Not on file   Years of education: Not on file   Highest education level: Not on file  Occupational History   Not on file  Tobacco Use   Smoking status: Never   Smokeless tobacco: Never  Vaping Use   Vaping status: Every Day  Substance and Sexual Activity   Alcohol use: Yes    Comment: social   Drug use: Yes    Frequency: 1.0 times per week    Types: Marijuana    Comment: smokes in the evenings   Sexual activity: Yes    Birth control/protection: Inserts  Other Topics Concern   Not on file  Social History Narrative   Not on file   Social Determinants of Health   Financial Resource Strain: Not on file  Food Insecurity: Not on file  Transportation Needs: Not on file  Physical Activity: Not on file  Stress: Not on file  Social Connections: Not on file    Allergies: No Known Allergies  Metabolic Disorder Labs: No results found for: "HGBA1C", "MPG" No results found for: "PROLACTIN" No results found  for: "CHOL", "TRIG", "HDL", "CHOLHDL", "VLDL", "LDLCALC" No results found for: "TSH"  Therapeutic Level Labs: No results found for: "LITHIUM" No results found for: "VALPROATE" No results found for: "CBMZ"  Current Medications: Current Outpatient Medications  Medication Sig Dispense Refill   buPROPion (WELLBUTRIN XL) 300 MG 24 hr tablet Take 1 tablet (300 mg total) by mouth every morning. 90 tablet 1   cephALEXin (KEFLEX) 500 MG capsule Take 1 capsule (500 mg total) by mouth 2 (two) times daily for 5 days. 10 capsule 0   ciprofloxacin (CIPRO) 750 MG tablet Take 1 tablet (750 mg total) by mouth 2 (two) times daily for 5 days. 10 tablet 0   lamoTRIgine (LAMICTAL) 100 MG tablet Take 1 tablet (100 mg total) by mouth 2 (two) times daily. 180 tablet 2   QUEtiapine (SEROQUEL) 100 MG tablet Take 1 tablet (100 mg total) by mouth at bedtime. 90 tablet 2   sertraline (ZOLOFT) 50 MG tablet Take 1 tablet (50 mg total) by mouth daily. 30 tablet 2   No current facility-administered medications for this visit.     Musculoskeletal: Strength & Muscle Tone: within normal limits Gait & Station: normal Patient leans: N/A  Psychiatric Specialty Exam: Review of Systems  All other systems reviewed and are negative.   There were no vitals taken for this visit.There is no height or weight on file to calculate BMI.  General Appearance: Casual and Fairly Groomed  Eye Contact:  Good  Speech:  Clear and Coherent  Volume:  Normal  Mood:  Euthymic  Affect:  Congruent  Thought Process:  Goal Directed  Orientation:  Full (Time, Place, and Person)  Thought Content: WDL   Suicidal Thoughts:  No  Homicidal Thoughts:  No  Memory:  Immediate;   Good Recent;   Good Remote;   Good  Judgement:  Fair  Insight:  Fair  Psychomotor Activity:  Decreased  Concentration:  Concentration: Good and Attention Span: Good  Recall:  Good  Fund of Knowledge: Good  Language: Good  Akathisia:  No  Handed:  Right  AIMS  (if indicated): not done  Assets:  Communication Skills Desire for Improvement Physical Health Resilience Social Support Talents/Skills  ADL's:  Intact  Cognition: WNL  Sleep:  Good   Screenings: PHQ2-9    Flowsheet Row Video Visit from 01/30/2022 in [facility] at [place] Video Visit from 08/02/2021 in [facility] at [facility] Total Score 0 0      Flowsheet Row ED from 03/12/2023 in [facility] Emergency Department at [facility] Video Visit from 08/02/2021 in [facility] Outpatient  Behavioral Health at [facility] Video Visit from 09/26/2020 in [facility] at [place]  C-SSRS RISK CATEGORY No Risk No Risk No Risk        Assessment and Plan: This patient is a 22 year old female with a history of depression anxiety and PTSD.  We have cut down Zoloft to 50 mg and she would like to continue at this dosage along with Wellbutrin XL 300 mg daily for depression and sexual side effects.  She will increase Lamictal to 200 mg daily gradually as this may help her energy.  She will continue Seroquel 100 mg at bedtime for mood stabilization.  She will return to see me in 3 months or call sooner as needed  Collaboration of Care: Collaboration of Care: Primary Care Provider AEB notes was shared with PCP at patient's request  Patient/Guardian was advised Release of Information must be obtained prior to any record release in order to collaborate their care with an outside provider. Patient/Guardian was advised if they have not already done so to contact the registration department to sign all necessary forms in order for [place] to release information regarding their care.   Consent: Patient/Guardian gives verbal consent for treatment and assignment of benefits for services provided during this visit. Patient/Guardian expressed understanding and agreed to proceed.    Diannia Ruder, MD 03/13/2023, 1:43  PM

## 2023-05-03 ENCOUNTER — Encounter (HOSPITAL_COMMUNITY): Payer: Self-pay

## 2023-05-06 ENCOUNTER — Other Ambulatory Visit (HOSPITAL_COMMUNITY): Payer: Self-pay | Admitting: Psychiatry

## 2023-05-06 MED ORDER — QUETIAPINE FUMARATE 100 MG PO TABS: 100.0000 mg | ORAL_TABLET | Freq: Every day | ORAL | 2 refills | Status: DC

## 2023-06-24 ENCOUNTER — Telehealth (HOSPITAL_COMMUNITY): Payer: 59 | Admitting: Psychiatry

## 2023-06-24 ENCOUNTER — Encounter (HOSPITAL_COMMUNITY): Payer: Self-pay | Admitting: Psychiatry

## 2023-06-24 DIAGNOSIS — F331 Major depressive disorder, recurrent, moderate: Secondary | ICD-10-CM

## 2023-06-24 DIAGNOSIS — F4310 Post-traumatic stress disorder, unspecified: Secondary | ICD-10-CM

## 2023-06-24 MED ORDER — LAMOTRIGINE 100 MG PO TABS: 100.0000 mg | ORAL_TABLET | Freq: Every day | ORAL | 2 refills | Status: DC

## 2023-06-24 MED ORDER — BUPROPION HCL ER (XL) 300 MG PO TB24: 300.0000 mg | ORAL_TABLET | ORAL | 1 refills | Status: DC

## 2023-06-24 MED ORDER — SERTRALINE HCL 50 MG PO TABS: 75.0000 mg | ORAL_TABLET | Freq: Every day | ORAL | 2 refills | Status: DC

## 2023-06-24 MED ORDER — QUETIAPINE FUMARATE 100 MG PO TABS: 100.0000 mg | ORAL_TABLET | Freq: Every day | ORAL | 2 refills | Status: DC

## 2023-06-24 NOTE — Progress Notes (Signed)
 Virtual Visit via Video Note  I connected with [facility] on 06/24/23 at  1:40 PM EST by a video enabled telemedicine application and verified that I am speaking with the correct person using two identifiers.  Location: Patient: home Provider: office   I discussed the limitations of evaluation and management by telemedicine and the availability of in person appointments. The patient expressed understanding and agreed to proceed.     I discussed the assessment and treatment plan with the patient. The patient was provided an opportunity to ask questions and all were answered. The patient agreed with the plan and demonstrated an understanding of the instructions.   The patient was advised to call back or seek an in-person evaluation if the symptoms worsen or if the condition fails to improve as anticipated.  I provided 20 minutes of non-face-to-face time during this encounter.   Barnie Gull, MD  [facility] MD/PA/NP OP Progress Note  06/24/2023 2:02 PM Judith Cole  MRN:  [id number]  Chief Complaint:  Chief Complaint  Patient presents with   Anxiety   Depression   Follow-up   HPI: This patient is a 23 year old white female who lives with her fianc in [place]. She is currently working as a futures trader.   The patient returns for follow-up after 3 months regarding her depression and anxiety.  Last time she felt very sluggish so we increased her Lamictal  but she called and stated that she went back down on it a couple of months later.  It was making her feel somewhat ill.  She also went back to the slightly higher dose of Zoloft  of 75 mg.  She states that she has gotten back in touch with her biological father and his mother and they are spending more time together.  She is also in touch with her adoptive parents.  She seems to be happy and upbeat and denies thoughts of self-harm or suicide.  She is staying active in her household and has been breathing rabbits. Visit Diagnosis:     ICD-10-CM   1. Major depressive disorder, recurrent episode, moderate with anxious distress (HCC)  F33.1     2. PTSD (post-traumatic stress disorder)  F43.10       Past Psychiatric History: Past hospitalizations and 1 stay in a PT RF as a teenager. She has [facility]   Past Medical History:  Past Medical History:  Diagnosis Date   ADHD (attention deficit hyperactivity disorder)    Depression    PTSD (post-traumatic stress disorder)    History reviewed. No pertinent surgical history.  Family Psychiatric History: See below  Family History:  Family History  Problem Relation Age of Onset   Drug abuse Mother    Bipolar disorder Mother    Drug abuse Father    Eating disorder Sister    Drug abuse Sister    Drug abuse Sister     Social History:  Social History   Socioeconomic History   Marital status: Single    Spouse name: Not on file   Number of children: Not on file   Years of education: Not on file   Highest education level: Not on file  Occupational History   Not on file  Tobacco Use   Smoking status: Never   Smokeless tobacco: Never  Vaping Use   Vaping status: Every Day  Substance and Sexual Activity   Alcohol use: Yes    Comment: social   Drug use: Yes    Frequency: 1.0  times per week    Types: Marijuana    Comment: smokes in the evenings   Sexual activity: Yes    Birth control/protection: Inserts  Other Topics Concern   Not on file  Social History Narrative   Not on file   Social Drivers of Health   Financial Resource Strain: Not on file  Food Insecurity: Not on file  Transportation Needs: Not on file  Physical Activity: Not on file  Stress: Not on file  Social Connections: Not on file    Allergies: No Known Allergies  Metabolic Disorder Labs: No results found for: HGBA1C, MPG No results found for: PROLACTIN No results found for: CHOL, TRIG, HDL, CHOLHDL, VLDL, LDLCALC No results found for:  TSH  Therapeutic Level Labs: No results found for: LITHIUM No results found for: VALPROATE No results found for: CBMZ  Current Medications: Current Outpatient Medications  Medication Sig Dispense Refill   buPROPion  (WELLBUTRIN  XL) 300 MG 24 hr tablet Take 1 tablet (300 mg total) by mouth every morning. 90 tablet 1   lamoTRIgine  (LAMICTAL ) 100 MG tablet Take 1 tablet (100 mg total) by mouth daily. 90 tablet 2   QUEtiapine  (SEROQUEL ) 100 MG tablet Take 1 tablet (100 mg total) by mouth at bedtime. 90 tablet 2   sertraline  (ZOLOFT ) 50 MG tablet Take 1.5 tablets (75 mg total) by mouth daily. 45 tablet 2   No current facility-administered medications for this visit.     Musculoskeletal: Strength & Muscle Tone: within normal limits Gait & Station: normal Patient leans: N/A  Psychiatric Specialty Exam: Review of Systems  All other systems reviewed and are negative.   There were no vitals taken for this visit.There is no height or weight on file to calculate BMI.  General Appearance: Casual and Fairly Groomed  Eye Contact:  Good  Speech:  Clear and Coherent  Volume:  Normal  Mood:  Euthymic  Affect:  Congruent  Thought Process:  Goal Directed  Orientation:  Full (Time, Place, and Person)  Thought Content: WDL   Suicidal Thoughts:  No  Homicidal Thoughts:  No  Memory:  Immediate;   Good Recent;   Good Remote;   Fair  Judgement:  Good  Insight:  Good  Psychomotor Activity:  Normal  Concentration:  Concentration: Good and Attention Span: Good  Recall:  Good  Fund of Knowledge: Good  Language: Good  Akathisia:  No  Handed:  Right  AIMS (if indicated): not done  Assets:  Communication Skills Desire for Improvement Physical Health Resilience Social Support Talents/Skills  ADL's:  Intact  Cognition: WNL  Sleep:  Good   Screenings: PHQ2-9    Flowsheet Row Video Visit from 01/30/2022 in [facility] at [place] Video Visit from  08/02/2021 in [facility] at [facility] Total Score 0 0      Flowsheet Row ED from 03/12/2023 in [facility] at [facility] Video Visit from 08/02/2021 in [facility] Outpatient Behavioral [facility] at [place] Video Visit from 09/26/2020 in [facility] at [place]  C-SSRS RISK CATEGORY No Risk No Risk No Risk        Assessment and Plan: This patient is a 23 year old female with a history depression anxiety and PTSD.  She is doing well on her current regimen.  She will continue Zoloft  75 mg daily for depression and anxiety and along with Wellbutrin  XL 300 mg daily for depression and sexual side effects from Zoloft ,  Seroquel  100 mg at bedtime for mood stabilization and Lamictal  100 mg daily also for mood stabilization.  She will return to see me in 3 months  Collaboration of Care: Collaboration of Care: Primary Care Provider AEB patient notes will be shared with PCP at patient's request  Patient/Guardian was advised Release of Information must be obtained prior to any record release in order to collaborate their care with an outside provider. Patient/Guardian was advised if they have not already done so to contact the registration department to sign all necessary forms in order for us  to release information regarding their care.   Consent: Patient/Guardian gives verbal consent for treatment and assignment of benefits for services provided during this visit. Patient/Guardian expressed understanding and agreed to proceed.    Barnie Gull, MD 06/24/2023, 2:02 PM

## 2023-08-31 ENCOUNTER — Other Ambulatory Visit (HOSPITAL_COMMUNITY): Payer: Self-pay | Admitting: Psychiatry

## 2023-09-17 ENCOUNTER — Encounter (HOSPITAL_COMMUNITY): Payer: Self-pay | Admitting: Psychiatry

## 2023-09-17 ENCOUNTER — Telehealth (INDEPENDENT_AMBULATORY_CARE_PROVIDER_SITE_OTHER): Payer: 59 | Admitting: Psychiatry

## 2023-09-17 DIAGNOSIS — F331 Major depressive disorder, recurrent, moderate: Secondary | ICD-10-CM | POA: Diagnosis not present

## 2023-09-17 DIAGNOSIS — F4310 Post-traumatic stress disorder, unspecified: Secondary | ICD-10-CM

## 2023-09-17 MED ORDER — QUETIAPINE FUMARATE 100 MG PO TABS: 100.0000 mg | ORAL_TABLET | Freq: Every day | ORAL | 2 refills | Status: DC

## 2023-09-17 MED ORDER — LAMOTRIGINE 100 MG PO TABS: 100.0000 mg | ORAL_TABLET | Freq: Every day | ORAL | 2 refills | Status: DC

## 2023-09-17 MED ORDER — BUPROPION HCL ER (XL) 300 MG PO TB24: 300.0000 mg | ORAL_TABLET | ORAL | 1 refills | Status: DC

## 2023-09-17 MED ORDER — SERTRALINE HCL 100 MG PO TABS: 150.0000 mg | ORAL_TABLET | Freq: Every day | ORAL | 2 refills | Status: DC

## 2023-09-17 NOTE — Progress Notes (Signed)
 Virtual Visit via Video Note  I connected with [facility] on 09/17/23 at  1:00 PM EDT by a video enabled telemedicine application and verified that I am speaking with the correct person using two identifiers.  Location: Patient: home Provider: office   I discussed the limitations of evaluation and management by telemedicine and the availability of in person appointments. The patient expressed understanding and agreed to proceed.     I discussed the assessment and treatment plan with the patient. The patient was provided an opportunity to ask questions and all were answered. The patient agreed with the plan and demonstrated an understanding of the instructions.   The patient was advised to call back or seek an in-person evaluation if the symptoms worsen or if the condition fails to improve as anticipated.  I provided 20 minutes of non-face-to-face time during this encounter.   Diannia Ruder, MD  [facility] MD/PA/NP OP Progress Note  09/17/2023 1:16 PM Judith Cole  MRN:  [id number]  Chief Complaint:  Chief Complaint  Patient presents with   Depression   Anxiety   Follow-up   HPI:  This patient is a 23 year old white female who lives with her fianc in [place]. She is currently working as a Futures trader.   The patient returns for follow-up after 3 months regarding her depression and anxiety and mood swings.  Overall she is doing very well.  Her sister has moved back in and she is trying to get her sister some help through therapy.  She and her boyfriend are getting along extremely well.  Her mood has been good and she is working at [employer] on her property.  She is sleeping well and her energy is good.  For some reason we had confusion about her Zoloft dosage.  I assume she was taking 75 mg but actually she is taking 150.  She has no side effects on this dosage and feels good so we will continue it.  She denies any thoughts of self-harm or suicide Visit Diagnosis:    ICD-10-CM    1. Major depressive disorder, recurrent episode, moderate with anxious distress (HCC)  F33.1     2. PTSD (post-traumatic stress disorder)  F43.10       Past Psychiatric History: Past hospitalizations and 1 stay in a PT RF as a teenager. [facility]   Past Medical History:  Past Medical History:  Diagnosis Date   ADHD (attention deficit hyperactivity disorder)    Depression    PTSD (post-traumatic stress disorder)    History reviewed. No pertinent surgical history.  Family Psychiatric History: See below  Family History:  Family History  Problem Relation Age of Onset   Drug abuse Mother    Bipolar disorder Mother    Drug abuse Father    Eating disorder Sister    Drug abuse Sister    Drug abuse Sister     Social History:  Social History   Socioeconomic History   Marital status: Single    Spouse name: Not on file   Number of children: Not on file   Years of education: Not on file   Highest education level: Not on file  Occupational History   Not on file  Tobacco Use   Smoking status: Never   Smokeless tobacco: Never  Vaping Use   Vaping status: Every Day  Substance and Sexual Activity   Alcohol use: Yes    Comment: social   Drug use: Yes  Frequency: 1.0 times per week    Types: Marijuana    Comment: smokes in the evenings   Sexual activity: Yes    Birth control/protection: Inserts  Other Topics Concern   Not on file  Social History Narrative   Not on file   Social Drivers of Health   Financial Resource Strain: Not on file  Food Insecurity: Not on file  Transportation Needs: Not on file  Physical Activity: Not on file  Stress: Not on file  Social Connections: Not on file    Allergies: No Known Allergies  Metabolic Disorder Labs: No results found for: "HGBA1C", "MPG" No results found for: "PROLACTIN" No results found for: "CHOL", "TRIG", "HDL", "CHOLHDL", "VLDL", "LDLCALC" No results found for:  "TSH"  Therapeutic Level Labs: No results found for: "LITHIUM" No results found for: "VALPROATE" No results found for: "CBMZ"  Current Medications: Current Outpatient Medications  Medication Sig Dispense Refill   sertraline (ZOLOFT) 100 MG tablet Take 1.5 tablets (150 mg total) by mouth daily. 135 tablet 2   buPROPion (WELLBUTRIN XL) 300 MG 24 hr tablet Take 1 tablet (300 mg total) by mouth every morning. 90 tablet 1   lamoTRIgine (LAMICTAL) 100 MG tablet Take 1 tablet (100 mg total) by mouth daily. 90 tablet 2   QUEtiapine (SEROQUEL) 100 MG tablet Take 1 tablet (100 mg total) by mouth at bedtime. 90 tablet 2   No current facility-administered medications for this visit.     Musculoskeletal: Strength & Muscle Tone: within normal limits Gait & Station: normal Patient leans: N/A  Psychiatric Specialty Exam: Review of Systems  All other systems reviewed and are negative.   There were no vitals taken for this visit.There is no height or weight on file to calculate BMI.  General Appearance: Casual and Fairly Groomed  Eye Contact:  Good  Speech:  Clear and Coherent  Volume:  Normal  Mood:  Euthymic  Affect:  Congruent  Thought Process:  Goal Directed  Orientation:  Full (Time, Place, and Person)  Thought Content: WDL   Suicidal Thoughts:  No  Homicidal Thoughts:  No  Memory:  Immediate;   Good Recent;   Good Remote;   Good  Judgement:  Good  Insight:  Good  Psychomotor Activity:  Normal  Concentration:  Concentration: Good and Attention Span: Good  Recall:  Good  Fund of Knowledge: Good  Language: Good  Akathisia:  No  Handed:  Right  AIMS (if indicated): not done  Assets:  Communication Skills Desire for Improvement Physical Health Resilience Social Support Talents/Skills  ADL's:  Intact  Cognition: WNL  Sleep:  Good   Screenings: PHQ2-9    Flowsheet Row Video Visit from 01/30/2022 in [place] [facility] at [place] Video Visit from  08/02/2021 in [facility] Total Score 0 0      Flowsheet Row ED from 03/12/2023 in [facility] Department at [facility] Video Visit from 08/02/2021 in [facility]al Health at [place] Video Visit from 09/26/2020 in [facility]al [facility] at [place]  C-SSRS RISK CATEGORY No Risk No Risk No Risk        Assessment and Plan: This patient is a 23 year old female with a history of depression anxiety and PTSD.  She continues to do well on her current regimen.  She will continue Zoloft 150 mg daily for depression and anxiety, Wellbutrin XL 300 mg daily for depression and sexual side effects from  Zoloft, Seroquel 100 mg at bedtime for mood stabilization and Lamictal 100 mg daily also for mood stabilization.  She will return to see me in 3 months  Collaboration of Care: Collaboration of Care: Primary Care Provider AEB notes will be shared with PCP at patient's request  Patient/Guardian was advised Release of Information must be obtained prior to any record release in order to collaborate their care with an outside provider. Patient/Guardian was advised if they have not already done so to contact the registration department to sign all necessary forms in order for [place] to release information regarding their care.   Consent: Patient/Guardian gives verbal consent for treatment and assignment of benefits for services provided during this visit. Patient/Guardian expressed understanding and agreed to proceed.    Diannia Ruder, MD 09/17/2023, 1:16 PM

## 2023-11-12 ENCOUNTER — Encounter (HOSPITAL_COMMUNITY): Payer: Self-pay

## 2023-12-04 ENCOUNTER — Telehealth (INDEPENDENT_AMBULATORY_CARE_PROVIDER_SITE_OTHER): Admitting: Psychiatry

## 2023-12-04 ENCOUNTER — Encounter (HOSPITAL_COMMUNITY): Payer: Self-pay | Admitting: Psychiatry

## 2023-12-04 DIAGNOSIS — F331 Major depressive disorder, recurrent, moderate: Secondary | ICD-10-CM | POA: Diagnosis not present

## 2023-12-04 DIAGNOSIS — F4310 Post-traumatic stress disorder, unspecified: Secondary | ICD-10-CM | POA: Diagnosis not present

## 2023-12-04 MED ORDER — LAMOTRIGINE 100 MG PO TABS: 100.0000 mg | ORAL_TABLET | Freq: Every day | ORAL | 2 refills | Status: DC

## 2023-12-04 MED ORDER — SERTRALINE HCL 100 MG PO TABS: 150.0000 mg | ORAL_TABLET | Freq: Every day | ORAL | 2 refills | Status: DC

## 2023-12-04 MED ORDER — QUETIAPINE FUMARATE 100 MG PO TABS: 100.0000 mg | ORAL_TABLET | Freq: Every day | ORAL | 2 refills | Status: DC

## 2023-12-04 MED ORDER — BUPROPION HCL ER (XL) 300 MG PO TB24: 300.0000 mg | ORAL_TABLET | ORAL | 1 refills | Status: DC

## 2023-12-04 NOTE — Progress Notes (Signed)
 Virtual Visit via Video Note  I connected with [facility] on 12/04/23 at  1:40 PM EDT by a video enabled telemedicine application and verified that I am speaking with the correct person using two identifiers.  Location: Patient: home Provider: office   I discussed the limitations of evaluation and management by telemedicine and the availability of in person appointments. The patient expressed understanding and agreed to proceed.      I discussed the assessment and treatment plan with the patient. The patient was provided an opportunity to ask questions and all were answered. The patient agreed with the plan and demonstrated an understanding of the instructions.   The patient was advised to call back or seek an in-person evaluation if the symptoms worsen or if the condition fails to improve as anticipated.  I provided 20 minutes of non-face-to-face time during this encounter.   Alfredia Annas, MD  [facility] MD/PA/NP OP Progress Note  12/04/2023 2:00 PM Judith Cole  MRN:  [id number]  Chief Complaint:  Chief Complaint  Patient presents with   Anxiety   Depression   Follow-up   HPI: This patient is a 23 year old white female who lives with her fianc in [place]. She is currently working as a Futures trader.   The patient returns for follow-up after 3 months regarding her depression anxiety and mood swings.  Overall she is doing well.  Her sister who is living with her is now 5 months pregnant but she seems to be handling this well.  She is still taking care of a lot of animals on her property.  She denies significant depression anxiety or mood swings.  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  She is also talking about possibly getting pregnant in the next couple of years and asked about the medications.  I explained that this would need to be coordinated with her OB/GYN but in general we try not to stop psychiatric medications to prevent relapse during pregnancy.  She is not  in any rush to get pregnant and is still using birth control. Visit Diagnosis:    ICD-10-CM   1. Major depressive disorder, recurrent episode, moderate with anxious distress (HCC)  F33.1     2. PTSD (post-traumatic stress disorder)  F43.10       Past Psychiatric History: Past hospitalizations and 1 stay in a PT RF as a teenager. [facility]   Past Medical History:  Past Medical History:  Diagnosis Date   ADHD (attention deficit hyperactivity disorder)    Depression    PTSD (post-traumatic stress disorder)    History reviewed. No pertinent surgical history.  Family Psychiatric History: See below  Family History:  Family History  Problem Relation Age of Onset   Drug abuse Mother    Bipolar disorder Mother    Drug abuse Father    Eating disorder Sister    Drug abuse Sister    Drug abuse Sister     Social History:  Social History   Socioeconomic History   Marital status: Single    Spouse name: Not on file   Number of children: Not on file   Years of education: Not on file   Highest education level: Not on file  Occupational History   Not on file  Tobacco Use   Smoking status: Never   Smokeless tobacco: Never  Vaping Use   Vaping status: Every Day  Substance and Sexual Activity   Alcohol use: Yes  Comment: social   Drug use: Yes    Frequency: 1.0 times per week    Types: Marijuana    Comment: smokes in the evenings   Sexual activity: Yes    Birth control/protection: Inserts  Other Topics Concern   Not on file  Social History Narrative   Not on file   Social Drivers of Health   Financial Resource Strain: Not on file  Food Insecurity: Not on file  Transportation Needs: Not on file  Physical Activity: Not on file  Stress: Not on file  Social Connections: Not on file    Allergies: No Known Allergies  Metabolic Disorder Labs: No results found for: HGBA1C, MPG No results found for: PROLACTIN No results found  for: CHOL, TRIG, HDL, CHOLHDL, VLDL, LDLCALC No results found for: TSH  Therapeutic Level Labs: No results found for: LITHIUM No results found for: VALPROATE No results found for: CBMZ  Current Medications: Current Outpatient Medications  Medication Sig Dispense Refill   buPROPion  (WELLBUTRIN  XL) 300 MG 24 hr tablet Take 1 tablet (300 mg total) by mouth every morning. 90 tablet 1   lamoTRIgine  (LAMICTAL ) 100 MG tablet Take 1 tablet (100 mg total) by mouth daily. 90 tablet 2   QUEtiapine  (SEROQUEL ) 100 MG tablet Take 1 tablet (100 mg total) by mouth at bedtime. 90 tablet 2   sertraline  (ZOLOFT ) 100 MG tablet Take 1.5 tablets (150 mg total) by mouth daily. 135 tablet 2   No current facility-administered medications for this visit.     Musculoskeletal: Strength & Muscle Tone: within normal limits Gait & Station: normal Patient leans: N/A  Psychiatric Specialty Exam: Review of Systems  All other systems reviewed and are negative.   There were no vitals taken for this visit.There is no height or weight on file to calculate BMI.  General Appearance: Casual and Fairly Groomed  Eye Contact:  Good  Speech:  Clear and Coherent  Volume:  Normal  Mood:  Euthymic  Affect:  Congruent  Thought Process:  Goal Directed  Orientation:  Full (Time, Place, and Person)  Thought Content: WDL   Suicidal Thoughts:  No  Homicidal Thoughts:  No  Memory:  Immediate;   Good Recent;   Good Remote;   Good  Judgement:  Good  Insight:  Fair  Psychomotor Activity:  Normal  Concentration:  Concentration: Good and Attention Span: Good  Recall:  Good  Fund of Knowledge: Good  Language: Good  Akathisia:  No  Handed:  Right  AIMS (if indicated): not done  Assets:  Communication Skills Desire for Improvement Physical Health Resilience Social Support  ADL's:  Intact  Cognition: WNL  Sleep:  Good   Screenings: PHQ2-9    Flowsheet Row Video Visit from 01/30/2022 in [place]  [facility] at [place] Video Visit from 08/02/2021 in [facility] Total Score 0 0   Flowsheet Row ED from 03/12/2023 in [facility] at [place] [facility] Video Visit from 08/02/2021 in [facility] at [place] Video Visit from 09/26/2020 in [facility] at [place]  C-SSRS RISK CATEGORY No Risk No Risk No Risk     Assessment and Plan: This patient is a 23 year old female with a history depression anxiety and PTSD.  For now she is doing well on her current regimen.  She will continue Zoloft  150 mg daily for depression and anxiety, Wellbutrin  XL 300 mg daily for depression and sexual  side effects from Zoloft , Seroquel  100 mg at bedtime for mood stabilization and Lamictal  100 mg daily also for mood stabilization.  She will return to see me in 3 months  Collaboration of Care: Collaboration of Care: Primary Care Provider AEB notes to be shared with PCP at patient's request  Patient/Guardian was advised Release of Information must be obtained prior to any record release in order to collaborate their care with an outside provider. Patient/Guardian was advised if they have not already done so to contact the registration department to sign all necessary forms in order for us  to release information regarding their care.   Consent: Patient/Guardian gives verbal consent for treatment and assignment of benefits for services provided during this visit. Patient/Guardian expressed understanding and agreed to proceed.    Alfredia Annas, MD 12/04/2023, 2:00 PM

## 2023-12-09 ENCOUNTER — Telehealth (HOSPITAL_COMMUNITY): Admitting: Psychiatry

## 2024-02-29 ENCOUNTER — Other Ambulatory Visit (HOSPITAL_COMMUNITY): Payer: Self-pay | Admitting: Psychiatry

## 2024-03-05 ENCOUNTER — Encounter (HOSPITAL_COMMUNITY): Payer: Self-pay | Admitting: Psychiatry

## 2024-03-05 ENCOUNTER — Telehealth (HOSPITAL_COMMUNITY): Admitting: Psychiatry

## 2024-03-05 DIAGNOSIS — F331 Major depressive disorder, recurrent, moderate: Secondary | ICD-10-CM

## 2024-03-05 DIAGNOSIS — F4310 Post-traumatic stress disorder, unspecified: Secondary | ICD-10-CM | POA: Diagnosis not present

## 2024-03-05 NOTE — Progress Notes (Signed)
 Virtual Visit via Video Note  I connected with [facility] on 03/05/24 at  1:20 PM EDT by a video enabled telemedicine application and verified that I am speaking with the correct person using two identifiers.  Location: Patient: home Provider: office   I discussed the limitations of evaluation and management by telemedicine and the availability of in person appointments. The patient expressed understanding and agreed to proceed.     I discussed the assessment and treatment plan with the patient. The patient was provided an opportunity to ask questions and all were answered. The patient agreed with the plan and demonstrated an understanding of the instructions.   The patient was advised to call back or seek an in-person evaluation if the symptoms worsen or if the condition fails to improve as anticipated.  I provided 20 minutes of non-face-to-face time during this encounter.   Barnie Gull, MD  [facility] MD/PA/NP OP Progress Note  03/05/2024 1:35 PM Judith Cole  MRN:  [id number]  Chief Complaint:  Chief Complaint  Patient presents with   Depression   Anxiety   Follow-up   HPI: This patient is a 23 year old white female who lives with her fianc in [place].  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  The patient returns for follow-up after 3 months regarding her major depression in remission, generalized anxiety disorder and posttraumatic stress disorder.   She states that overall she is doing well.  She is going to try to find some part-time jobs to help augment her income.  She and her fianc are getting married in December and she is very excited about it.  She states that her mood has been stable and she denies significant depression anxiety mood swings or thoughts of self-harm.  She has not experienced anger or irritability.  She is sleeping well. Visit Diagnosis:    ICD-10-CM   1. Major depressive disorder, recurrent episode, moderate with  anxious distress (HCC)  F33.1     2. PTSD (post-traumatic stress disorder)  F43.10       Past Psychiatric History: : Past hospitalizations and 1 stay in a PT RF as a teenager. [facility]   Past Medical History:  Past Medical History:  Diagnosis Date   ADHD (attention deficit hyperactivity disorder)    Depression    PTSD (post-traumatic stress disorder)    No past surgical history on file.  Family Psychiatric History: See below  Family History:  Family History  Problem Relation Age of Onset   Drug abuse Mother    Bipolar disorder Mother    Drug abuse Father    Eating disorder Sister    Drug abuse Sister    Drug abuse Sister     Social History:  Social History   Socioeconomic History   Marital status: Single    Spouse name: Not on file   Number of children: Not on file   Years of education: Not on file   Highest education level: Not on file  Occupational History   Not on file  Tobacco Use   Smoking status: Never   Smokeless tobacco: Never  Vaping Use   Vaping status: Every Day  Substance and Sexual Activity   Alcohol use: Yes    Comment: social   Drug use: Yes    Frequency: 1.0 times per week    Types: Marijuana    Comment: smokes in the evenings   Sexual activity: Yes  Birth control/protection: Inserts  Other Topics Concern   Not on file  Social History Narrative   Not on file   Social Drivers of Health   Financial Resource Strain: Not on file  Food Insecurity: Not on file  Transportation Needs: Not on file  Physical Activity: Not on file  Stress: Not on file  Social Connections: Not on file    Allergies: No Known Allergies  Metabolic Disorder Labs: No results found for: HGBA1C, MPG No results found for: PROLACTIN No results found for: CHOL, TRIG, HDL, CHOLHDL, VLDL, LDLCALC No results found for: TSH  Therapeutic Level Labs: No results found for: LITHIUM No results found for:  VALPROATE No results found for: CBMZ  Current Medications: Current Outpatient Medications  Medication Sig Dispense Refill   buPROPion  (WELLBUTRIN  XL) 300 MG 24 hr tablet Take 1 tablet (300 mg total) by mouth every morning. 90 tablet 1   lamoTRIgine  (LAMICTAL ) 100 MG tablet Take 1 tablet (100 mg total) by mouth daily. 90 tablet 2   QUEtiapine  (SEROQUEL ) 100 MG tablet Take 1 tablet (100 mg total) by mouth at bedtime. 90 tablet 2   sertraline  (ZOLOFT ) 100 MG tablet Take 1.5 tablets (150 mg total) by mouth daily. 135 tablet 2   No current facility-administered medications for this visit.     Musculoskeletal: Strength & Muscle Tone: within normal limits Gait & Station: normal Patient leans: N/A  Psychiatric Specialty Exam: Review of Systems  All other systems reviewed and are negative.   There were no vitals taken for this visit.There is no height or weight on file to calculate BMI.  General Appearance: Casual and Fairly Groomed  Eye Contact:  Good  Speech:  Clear and Coherent  Volume:  Normal  Mood:  Euthymic  Affect:  Congruent  Thought Process:  Goal Directed  Orientation:  Full (Time, Place, and Person)  Thought Content: WDL   Suicidal Thoughts:  No  Homicidal Thoughts:  No  Memory:  Immediate;   Good Recent;   Good Remote;   NA  Judgement:  Good  Insight:  Fair  Psychomotor Activity:  Normal  Concentration:  Concentration: Good and Attention Span: Good  Recall:  Good  Fund of Knowledge: Good  Language: Good  Akathisia:  No  Handed:  Right  AIMS (if indicated): not done  Assets:  Communication Skills Desire for Improvement Physical Health Resilience Social Support Talents/Skills  ADL's:  Intact  Cognition: WNL  Sleep:  Good   Screenings: PHQ2-9    Flowsheet Row Video Visit from 01/30/2022 in [facility] [facility] at [place] Video Visit from 08/02/2021 in [facility] Total Score 0 0    Flowsheet Row ED from 03/12/2023 in [facility] Department at [facility] Video Visit from 08/02/2021 in [facility] at [place] Video Visit from 09/26/2020 in [facility] Outpatient [facility]al Health at [place]  C-SSRS RISK CATEGORY No Risk No Risk No Risk     Assessment and Plan: This patient is a 23 year old female with a history of major depression recurrent in remission, generalized anxiety disorder and posttraumatic stress disorder.  She continues to do well on her current regimen.  She will continue Zoloft  150 mg daily for depression and anxiety symptoms, Wellbutrin  XL 300 mg daily for depression and to prevent sexual side effects from Zoloft , Seroquel  100 mg at bedtime for mood stabilization and Lamictal  100 mg daily also for mood stabilization.  She will  return to see me in 4 months  Collaboration of Care: Collaboration of Care: Primary Care Provider AEB notes will be shared with PCP at patient's request  Patient/Guardian was advised Release of Information must be obtained prior to any record release in order to collaborate their care with an outside provider. Patient/Guardian was advised if they have not already done so to contact the registration department to sign all necessary forms in order for us  to release information regarding their care.   Consent: Patient/Guardian gives verbal consent for treatment and assignment of benefits for services provided during this visit. Patient/Guardian expressed understanding and agreed to proceed.    Barnie Gull, MD 03/05/2024, 1:35 PM

## 2024-03-26 ENCOUNTER — Encounter (HOSPITAL_COMMUNITY): Payer: Self-pay

## 2024-04-09 ENCOUNTER — Encounter (HOSPITAL_COMMUNITY): Payer: Self-pay

## 2024-04-09 NOTE — Telephone Encounter (Signed)
 I haven't heard any problems with my patients getting medications, the pharmacies are not controlled by the government

## 2024-04-27 ENCOUNTER — Ambulatory Visit (INDEPENDENT_AMBULATORY_CARE_PROVIDER_SITE_OTHER): Admitting: Clinical

## 2024-04-27 ENCOUNTER — Encounter (HOSPITAL_COMMUNITY): Payer: Self-pay

## 2024-04-27 DIAGNOSIS — F331 Major depressive disorder, recurrent, moderate: Secondary | ICD-10-CM | POA: Diagnosis not present

## 2024-04-27 DIAGNOSIS — F4310 Post-traumatic stress disorder, unspecified: Secondary | ICD-10-CM | POA: Diagnosis not present
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
## 2024-05-26 ENCOUNTER — Ambulatory Visit (HOSPITAL_COMMUNITY): Admitting: Clinical

## 2024-05-26 DIAGNOSIS — F4310 Post-traumatic stress disorder, unspecified: Secondary | ICD-10-CM

## 2024-05-26 DIAGNOSIS — F331 Major depressive disorder, recurrent, moderate: Secondary | ICD-10-CM

## 2024-05-26 NOTE — Progress Notes (Signed)
 Virtual Visit via Video Note  I connected with [facility] on 05/26/24 at  3:00 PM EST by a video enabled telemedicine application and verified that I am speaking with the correct person using two identifiers.  Location: Patient: home Provider: office   I discussed the limitations of evaluation and management by telemedicine and the availability of in person appointments. The patient expressed understanding and agreed to proceed.   THERAPIST PROGRESS NOTE     Session Time: 3:00 PM-3:20 PM   Participation Level: Active   Behavioral Response: Casual and Alert, Depressed   Type of Therapy: Individual Therapy   Treatment Goals addressed: Mood and Anxiety   Interventions: CBT   Summary: Judith Cole  a 23 y.o. female who presents with MDD with Anxiety / PTSD. The OPT therapist worked with the patient for her OPT treatment. The OPT therapist utilized Motivational Interviewing to assist in creating therapeutic repore. The patient in the session was engaged and work in collaboration giving feedback about her triggers and symptoms over the past few weeks.  The patient spoke about finishing planning for her upcoming wedding as well as becoming a new aunt with her sister recently having a baby. The patient spoke about her goals of titration with her med therapy, losing weight/eating healthier, and regulating of her sleep cycle.The OPT therapist utilized Cognitive Behavioral Therapy through cognitive restructuring as well as worked with the patient on coping strategies to assist in management of mood and anxiety. The OPT therapist worked with the patient on overviewing her basic need areas examining the patients current eating habits, sleep schedule, exercise, and hygiene. The patient spoke about having friends that have been a great supports and enjoying being around her family and her fiance's family for Thanksgiving and plans for the same in the upcoming Christmas holiday. 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 The patient identified her faith as a protective factor and has been reading her bible each night recently.   Suicidal/Homicidal: Nowithout intent/plan   Therapist Response:The OPT therapist worked with the patient for the patients scheduled session. The patient was engaged in her session and gave feedback in relation to triggers, symptoms, and behavior responses over the past few weeks.The patient spoke about finishing planning for her upcoming wedding as well as becoming a new aunt with her sister recently having a baby. The patient spoke about her goals of titration with her med therapy, losing weight/eating healthier, and regulating of her sleep cycle.The OPT therapist worked with the patient utilizing an in session Cognitive Behavioral Therapy exercise. The patient was responsive in the session and verbalized,  Things over the last few weeks have been going well now having the planning completed for the Wedding done that's been a stress relief. 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 The patient identified her faith as a protective factor and has been reading her bible each night recently.The patient spoke about willingness to be aware of automatic negative thoughts and to challenge them and worked completing a in session exercise around identifying, challenging , and filtering out negative thoughts around her current life situation. The OPT therapist worked with the patient providing ongoing psycho-education. The patient spoke about looking forward to the new year and getting back on track with her finances. The patient spoke about her  plans for the upcoming Christmas holiday. The The OPT therapist will work with the patient in her next scheduled session.     Plan: return in 2/3 weeks     Diagnosis:      Axis I: Recurrent MDD with Anxiety / PTSD  Axis II: No diagnosis       Collaboration of Care: Overview of patient involvement in med therapy through her PCP with Dr. Maryanne   Patient/Guardian was advised Release of Information must be obtained prior to any record release in order to collaborate their care with an outside provider. Patient/Guardian was advised if they have not already done so to contact the registration department to sign all necessary forms in order for us  to release information regarding their care.    Consent: Patient/Guardian gives verbal consent for treatment and assignment of benefits for services provided during this visit. Patient/Guardian expressed understanding and agreed to proceed.    I discussed the assessment and treatment plan with the patient. The patient was provided an opportunity to ask questions and all were answered. The patient agreed with the plan and demonstrated an understanding of the instructions.   The patient was advised to call back or seek an in-person evaluation if the symptoms worsen or if the condition fails to improve as anticipated.   I provided 20 minutes of non-face-to-face time during this encounter.   Jerel ONEIDA Pepper, LCSW   05/26/2024

## 2024-06-29 ENCOUNTER — Telehealth (HOSPITAL_COMMUNITY): Admitting: Psychiatry

## 2024-06-29 ENCOUNTER — Encounter (HOSPITAL_COMMUNITY): Payer: Self-pay | Admitting: Psychiatry

## 2024-06-29 DIAGNOSIS — F331 Major depressive disorder, recurrent, moderate: Secondary | ICD-10-CM

## 2024-06-29 DIAGNOSIS — F411 Generalized anxiety disorder: Secondary | ICD-10-CM | POA: Diagnosis not present

## 2024-06-29 DIAGNOSIS — F3342 Major depressive disorder, recurrent, in full remission: Secondary | ICD-10-CM | POA: Diagnosis not present

## 2024-06-29 DIAGNOSIS — Z8659 Personal history of other mental and behavioral disorders: Secondary | ICD-10-CM | POA: Diagnosis not present

## 2024-06-29 DIAGNOSIS — F4310 Post-traumatic stress disorder, unspecified: Secondary | ICD-10-CM

## 2024-06-29 MED ORDER — BUPROPION HCL ER (XL) 300 MG PO TB24: 300.0000 mg | ORAL_TABLET | ORAL | 1 refills | Status: AC

## 2024-06-29 MED ORDER — LAMOTRIGINE 100 MG PO TABS: 100.0000 mg | ORAL_TABLET | Freq: Every day | ORAL | 2 refills | Status: AC

## 2024-06-29 MED ORDER — QUETIAPINE FUMARATE 50 MG PO TABS: 50.0000 mg | ORAL_TABLET | Freq: Every day | ORAL | 2 refills | Status: AC

## 2024-06-29 MED ORDER — SERTRALINE HCL 100 MG PO TABS: 150.0000 mg | ORAL_TABLET | Freq: Every day | ORAL | 2 refills | Status: AC

## 2024-06-29 NOTE — Progress Notes (Signed)
 Virtual Visit via Video Note  I connected with [facility] on 06/29/2024 at  1:00 PM EST by a video enabled telemedicine application and verified that I am speaking with the correct person using two identifiers.  Location: Patient: home Provider: office   I discussed the limitations of evaluation and management by telemedicine and the availability of in person appointments. The patient expressed understanding and agreed to proceed.     I discussed the assessment and treatment plan with the patient. The patient was provided an opportunity to ask questions and all were answered. The patient agreed with the plan and demonstrated an understanding of the instructions.   The patient was advised to call back or seek an in-person evaluation if the symptoms worsen or if the condition fails to improve as anticipated.  I provided 20 minutes of non-face-to-face time during this encounter.   Barnie Gull, MD  [facility] MD/PA/NP OP Progress Note  06/29/2024 1:12 PM Judith Cole  MRN:  [id number]  Chief Complaint:  Chief Complaint  Patient presents with   Depression   Anxiety   Follow-up   HPI: : This patient is a 24 year old white female who lives with her husband in [place].  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   The patient returns for follow-up after 3 months regarding her major depression in remission, generalized anxiety disorder and posttraumatic stress disorder.  The patient states that since I last saw her she and her fianc got married on January 31.  They had a wedding at a venue and she stated it was really fun and went well.  They were already living together so nothing much has changed in her regular life.  In terms of medications they are going well although she feels a bit oversedated in the morning would like to reduce the Seroquel .  I suggested going from 100 to 50 mg at bedtime.  Her mood has been stable and she denies significant mood swings  depression suicidal thoughts.  Her anxiety is also under good control.  She is going to start watching her infant nephew.  The patient states that she has cut way back on her drinking and only now drinks occasionally during the week and a little bit on weekends. Visit Diagnosis:    ICD-10-CM   1. Major depressive disorder, recurrent episode, moderate with anxious distress (HCC)  F33.1     2. PTSD (post-traumatic stress disorder)  F43.10       Past Psychiatric History: Past hospitalizations and 1 stay in a PRT F as a teenager.  She [facility] also [facility]  Past Medical History:  Past Medical History:  Diagnosis Date   ADHD (attention deficit hyperactivity disorder)    Depression    PTSD (post-traumatic stress disorder)    History reviewed. No pertinent surgical history.  Family Psychiatric History: See below  Family History:  Family History  Problem Relation Age of Onset   Drug abuse Mother    Bipolar disorder Mother    Drug abuse Father    Eating disorder Sister    Drug abuse Sister    Drug abuse Sister     Social History:  Social History   Socioeconomic History   Marital status: Single    Spouse name: Not on file   Number of children: Not on file   Years of education: Not on file   Highest education level: Not on file  Occupational History  Not on file  Tobacco Use   Smoking status: Never   Smokeless tobacco: Never  Vaping Use   Vaping status: Every Day  Substance and Sexual Activity   Alcohol use: Yes    Comment: social   Drug use: Yes    Frequency: 1.0 times per week    Types: Marijuana    Comment: smokes in the evenings   Sexual activity: Yes    Birth control/protection: Inserts  Other Topics Concern   Not on file  Social History Narrative   Not on file   Social Drivers of Health   Tobacco Use: Low Risk (06/29/2024)   Patient History    Smoking Tobacco Use: Never    Smokeless Tobacco Use: Never    Passive Exposure:  Not on file  Financial Resource Strain: Not on file  Food Insecurity: Not on file  Transportation Needs: Not on file  Physical Activity: Not on file  Stress: Not on file  Social Connections: Not on file  Depression (PHQ2-9): High Risk (04/27/2024)   Depression (PHQ2-9)    PHQ-2 Score: 12  Alcohol Screen: Not on file  Housing: Not on file  Utilities: Not on file  Health Literacy: Not on file    Allergies: Allergies[1]  Metabolic Disorder Labs: No results found for: HGBA1C, MPG No results found for: PROLACTIN No results found for: CHOL, TRIG, HDL, CHOLHDL, VLDL, LDLCALC No results found for: TSH  Therapeutic Level Labs: No results found for: LITHIUM No results found for: VALPROATE No results found for: CBMZ  Current Medications: Current Outpatient Medications  Medication Sig Dispense Refill   QUEtiapine  (SEROQUEL ) 50 MG tablet Take 1 tablet (50 mg total) by mouth at bedtime. 90 tablet 2   buPROPion  (WELLBUTRIN  XL) 300 MG 24 hr tablet Take 1 tablet (300 mg total) by mouth every morning. 90 tablet 1   lamoTRIgine  (LAMICTAL ) 100 MG tablet Take 1 tablet (100 mg total) by mouth daily. 90 tablet 2   sertraline  (ZOLOFT ) 100 MG tablet Take 1.5 tablets (150 mg total) by mouth daily. 135 tablet 2   No current facility-administered medications for this visit.     Musculoskeletal: Strength & Muscle Tone: within normal limits Gait & Station: normal Patient leans: N/A  Psychiatric Specialty Exam: Review of Systems  All other systems reviewed and are negative.   There were no vitals taken for this visit.There is no height or weight on file to calculate BMI.  General Appearance: Casual and Fairly Groomed  Eye Contact:  Good  Speech:  Clear and Coherent  Volume:  Normal  Mood:  Euthymic  Affect:  Congruent  Thought Process:  Goal Directed  Orientation:  Full (Time, Place, and Person)  Thought Content: WDL   Suicidal Thoughts:  No  Homicidal  Thoughts:  No  Memory:  Immediate;   Good Recent;   Good Remote;   NA  Judgement:  Good  Insight:  Good  Psychomotor Activity:  Normal  Concentration:  Concentration: Good and Attention Span: Good  Recall:  Good  Fund of Knowledge: Good  Language: Good  Akathisia:  No  Handed:  Right  AIMS (if indicated): not done  Assets:  Communication Skills Desire for Improvement Physical Health Resilience Social Support Talents/Skills  ADL's:  Intact  Cognition: WNL  Sleep:  Good   Screenings: GAD-7    Advertising Copywriter from 04/27/2024 in [facility] [place]  Total GAD-7 Score 9   PHQ2-9    Flowsheet Row Counselor  from 04/27/2024 in [facility] [place] Video Visit from 01/30/2022 in [facility] [place] Video Visit from 08/02/2021 in [facility] Total Score 6 0 0  PHQ-9 Total Score 12 -- --   Flowsheet Row Counselor from 04/27/2024 in [facility] at [place] ED from 03/12/2023 in [facility] at [facility] Video Visit from 08/02/2021 in [facility] at [place]  C-SSRS RISK CATEGORY Error: Question 6 not populated No Risk No Risk     Assessment and Plan: This patient is a 24 year old female with a history of major depression recurrent in remission, generalized anxiety disorder and a history of posttraumatic stress disorder.  She does feel a bit oversedated in the morning so we will cut down Seroquel  from 100 to 50 mg at bedtime for mood stabilization.  She will continue Lamictal  100 mg daily also for mood stabilization.  She will continue Zoloft  150 mg daily for depression and anxiety and Wellbutrin  XL 300 mg daily for depression and to prevent sexual side effects from Zoloft .  She will return to see me in 4 months  Collaboration of Care:  Collaboration of Care: Primary Care Provider AEB notes will be shared with PCP at patient's request  Patient/Guardian was advised Release of Information must be obtained prior to any record release in order to collaborate their care with an outside provider. Patient/Guardian was advised if they have not already done so to contact the registration department to sign all necessary forms in order for us  to release information regarding their care.   Consent: Patient/Guardian gives verbal consent for treatment and assignment of benefits for services provided during this visit. Patient/Guardian expressed understanding and agreed to proceed.    Barnie Gull, MD 06/29/2024, 1:12 PM     [1] No Known Allergies

## 2024-06-30 ENCOUNTER — Ambulatory Visit (HOSPITAL_COMMUNITY): Admitting: Clinical

## 2024-10-27 ENCOUNTER — Telehealth (HOSPITAL_COMMUNITY): Admitting: Psychiatry
